# Patient Record
Sex: Female | Born: 1985 | Hispanic: Yes | Marital: Single | Smoking: Never smoker
Health system: Southern US, Community
[De-identification: ages and names within clinical notes are randomized; demographics above are authoritative.]

## PROBLEM LIST (undated history)

## (undated) DIAGNOSIS — R519 Headache, unspecified: Secondary | ICD-10-CM

## (undated) DIAGNOSIS — F419 Anxiety disorder, unspecified: Secondary | ICD-10-CM

## (undated) DIAGNOSIS — Z789 Other specified health status: Secondary | ICD-10-CM

## (undated) DIAGNOSIS — R51 Headache: Secondary | ICD-10-CM

## (undated) HISTORY — PX: NO PAST SURGERIES: SHX2092

---
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
## 2006-01-09 ENCOUNTER — Ambulatory Visit: Payer: Self-pay | Admitting: Certified Nurse Midwife

## 2006-01-09 ENCOUNTER — Inpatient Hospital Stay (HOSPITAL_COMMUNITY): Admission: AD | Admit: 2006-01-09 | Discharge: 2006-01-12 | Payer: Self-pay | Admitting: *Deleted

## 2006-01-09 ENCOUNTER — Inpatient Hospital Stay (HOSPITAL_COMMUNITY): Admission: AD | Admit: 2006-01-09 | Discharge: 2006-01-09 | Payer: Self-pay | Admitting: *Deleted

## 2006-01-21 ENCOUNTER — Inpatient Hospital Stay (HOSPITAL_COMMUNITY): Admission: AD | Admit: 2006-01-21 | Discharge: 2006-01-21 | Payer: Self-pay | Admitting: Family Medicine

## 2006-09-21 IMAGING — US US OB FOLLOW-UP
1 series · 13 of 28 positions shown · non-contrast
Comparison: none

CLINICAL DATA: 17 week 0 day assigned gestational age by LMP and early ultrasound.  Evaluate fetal anatomy and growth.

[Series 1: us ob follow-up · 0.18mm/px · 13 of 70 slices shown]
[im 3/70]
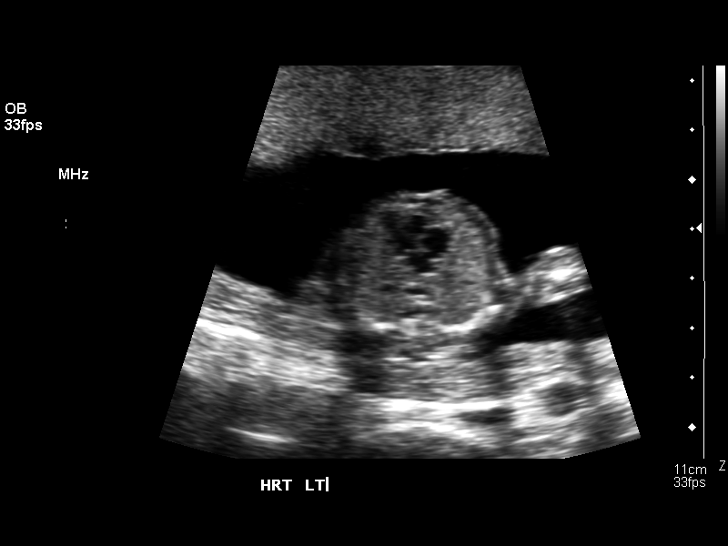
[im 8/70]
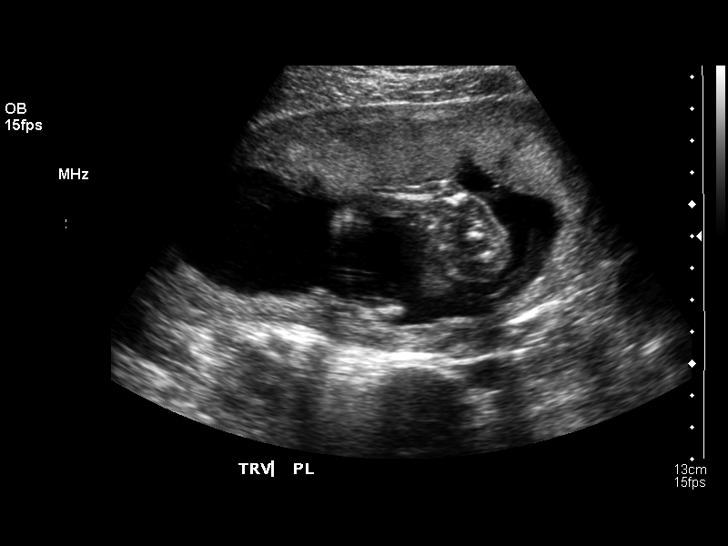
[im 13/70]
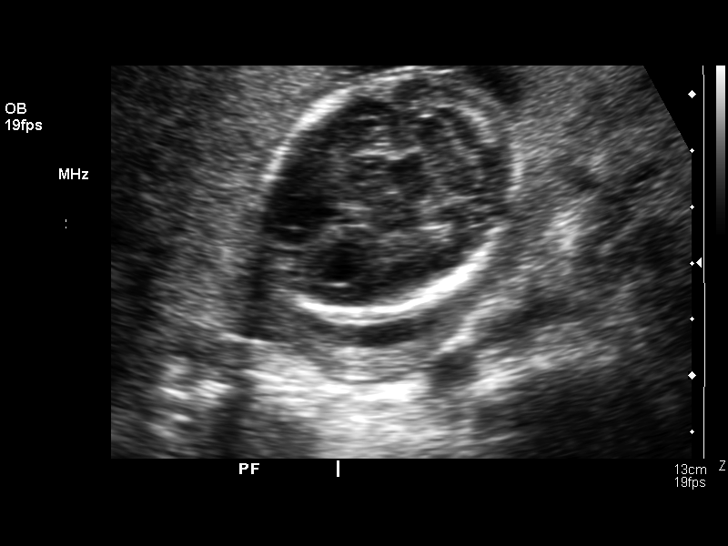
[im 18/70]
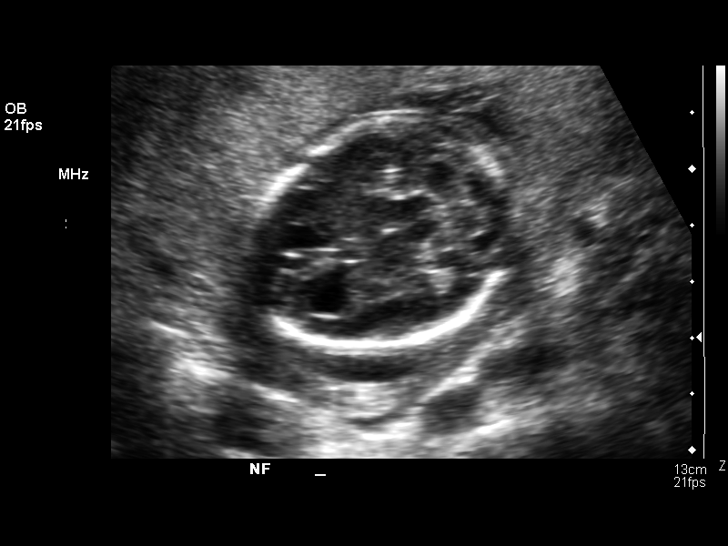
[im 24/70]
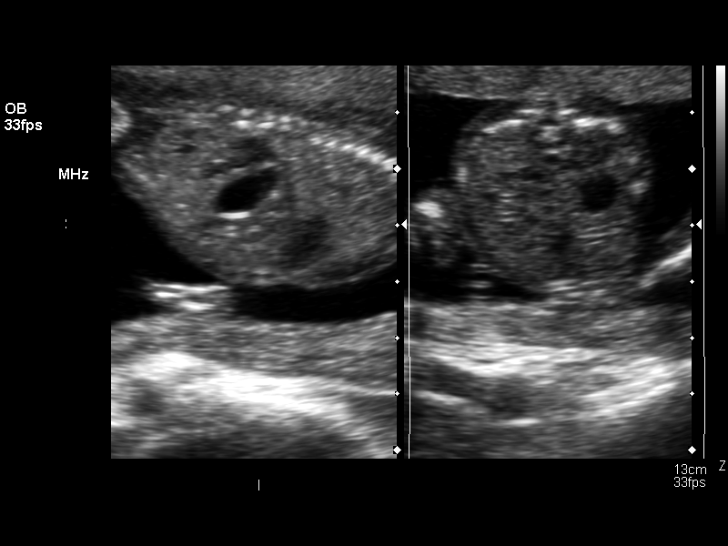
[im 29/70]
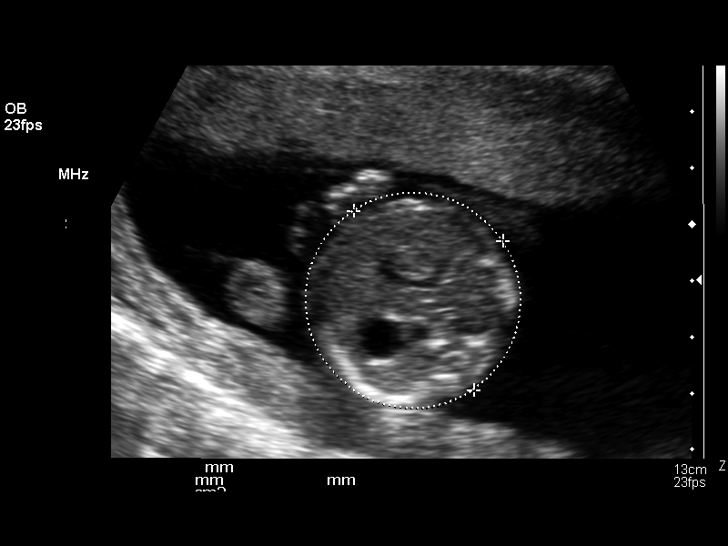
[im 36/70]
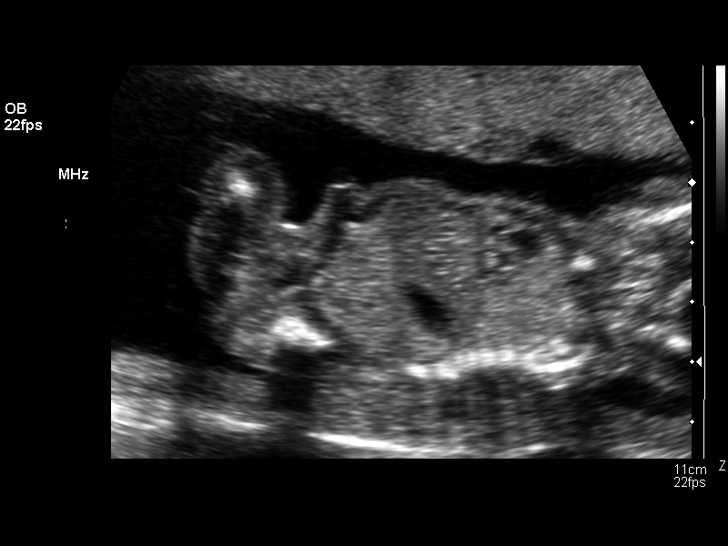
[im 41/70]
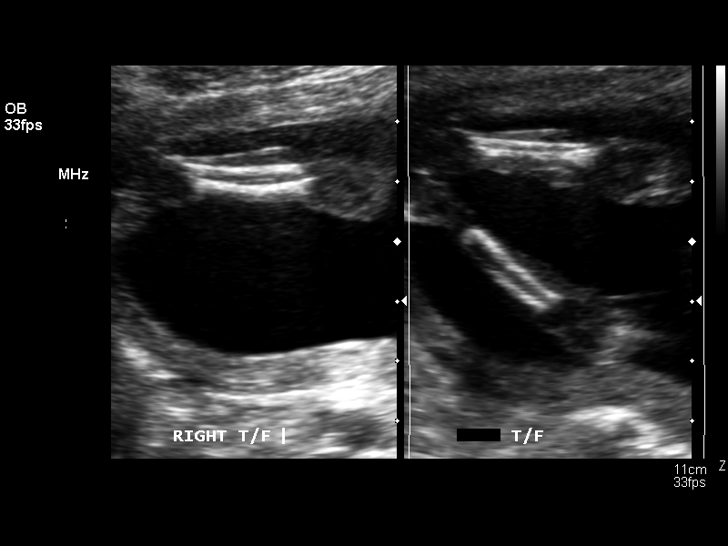
[im 47/70]
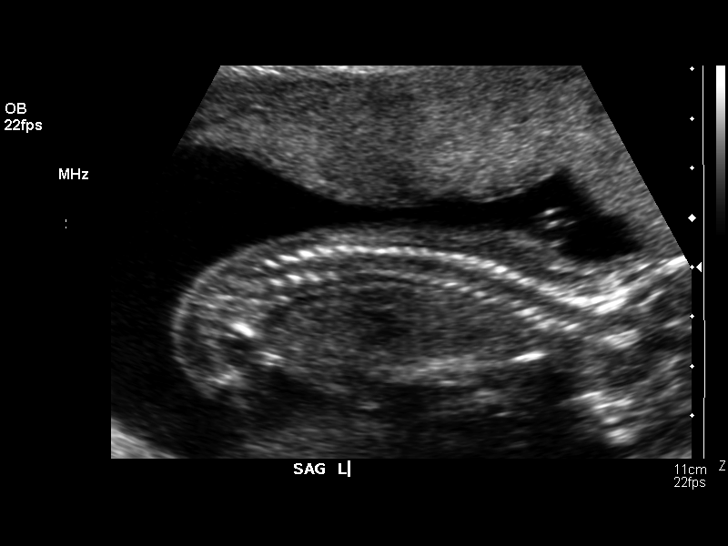
[im 52/70]
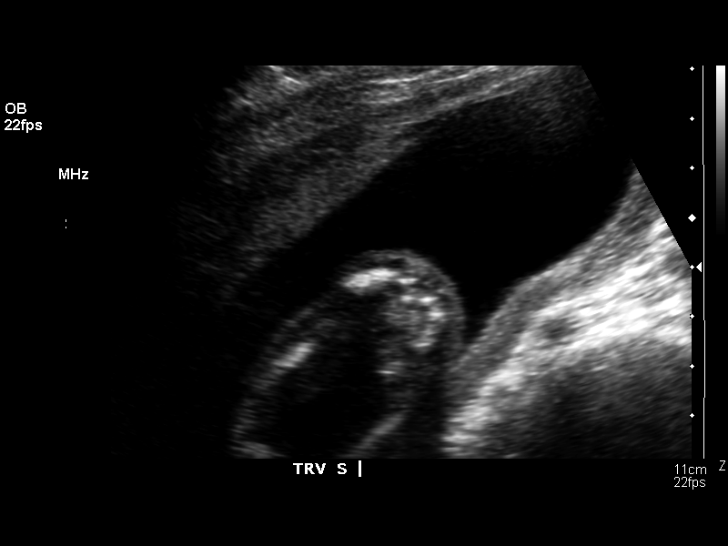
[im 57/70]
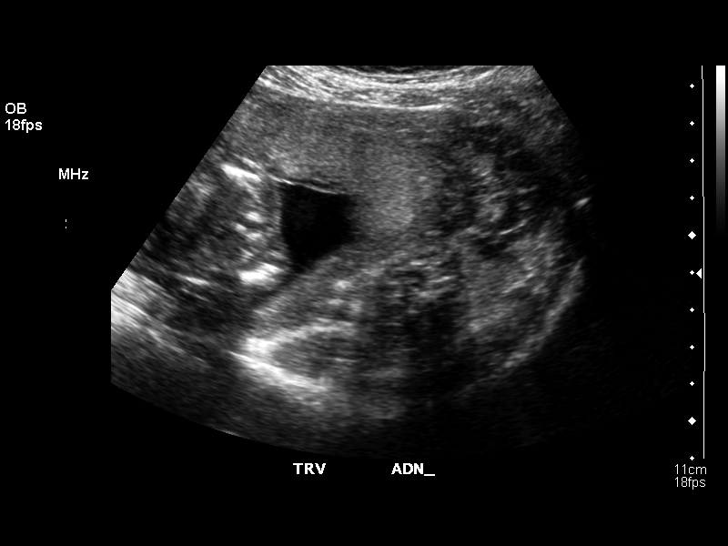
[im 62/70]
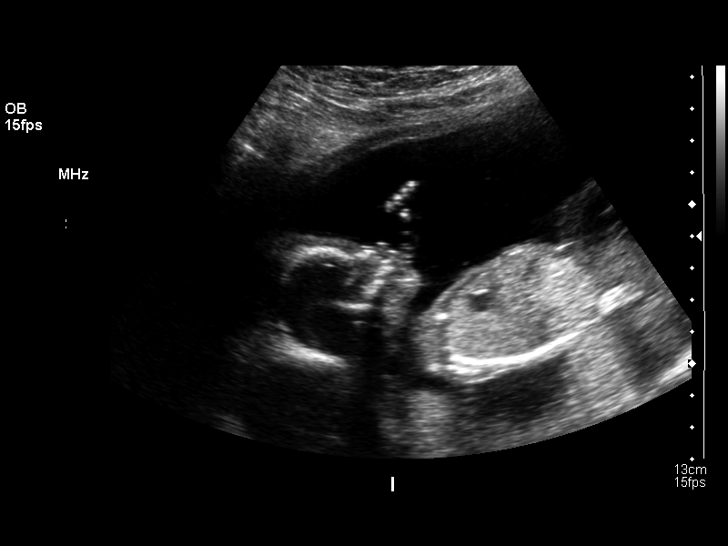
[im 67/70]
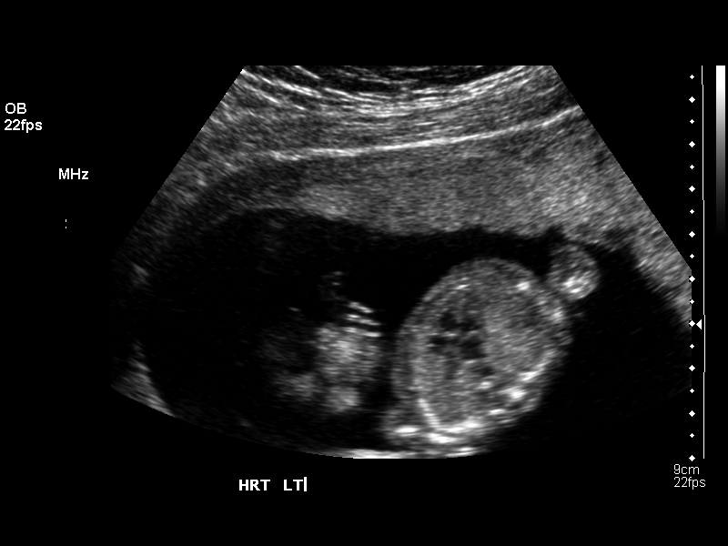

[13 of 28 positions shown; findings below may reference images not displayed]

OBSTETRICAL ULTRASOUND RE-EVALUATION:
 Number of Fetuses:  1
 Heart Rate:  144
 Movement:  Yes
 Breathing:  No
 Presentation:  Cephalic
 Placental Location:  Anterior
 Grade:  I
 Previa:  No
 Amniotic Fluid (subjective):  Normal
 Amniotic Fluid (objective):  4.8 cm Vertical pocket 

 FETAL BIOMETRY
 BPD:  3.9 cm   18 w 0 d
 HC:  14.8 cm  17 w 6 d
 AC:  12.0 cm   17 w 5 d
 FL:   2.5 cm  17 w 3 d

 Mean GA:  17 w 5 d
 Assigned GA:  17 w 0 d  Assigned EDC:  01/12/06

 FETAL ANATOMY
 Lateral Ventricles:  Visualized 
 Thalami/CSP:  Visualized 
 Posterior Fossa:  Visualized 
 Nuchal Region:  Visualized 
 Spine:  Visualized 
 4 Chamber Heart on Left:  Visualized 
 Stomach on Left:  Visualized 
 3 Vessel Cord:  Visualized 
 Cord Insertion Site:  Visualized 
 Kidneys:  Visualized 
 Bladder:  Visualized 
 Extremities:  Visualized 

 ADDITIONAL ANATOMY VISUALIZED:  LVOT, RVOT, upper lip, orbits, profile, diaphragm, heel, 5th digit, ductal arch, aortic arch, and male genitalia.

 MATERNAL UTERINE AND ADNEXAL FINDINGS
 Cervix:  3.2 cm Transabdominally.  Both ovaries are unremarkable.
IMPRESSION: 1.  Assigned gestational age is currently 17 weeks 0 days.  Appropriate fetal growth.
 2.  No evidence of fetal anatomic abnormality.

## 2007-01-06 IMAGING — US US OB FOLLOW-UP
1 series · 13 of 28 positions shown · non-contrast
Comparison: none

CLINICAL DATA: Evaluate growth; assigned gestational age is 32 weeks 2 days.

[Series 1: us ob follow-up · 0.35mm/px · 13 of 38 slices shown]
[im 2/38]
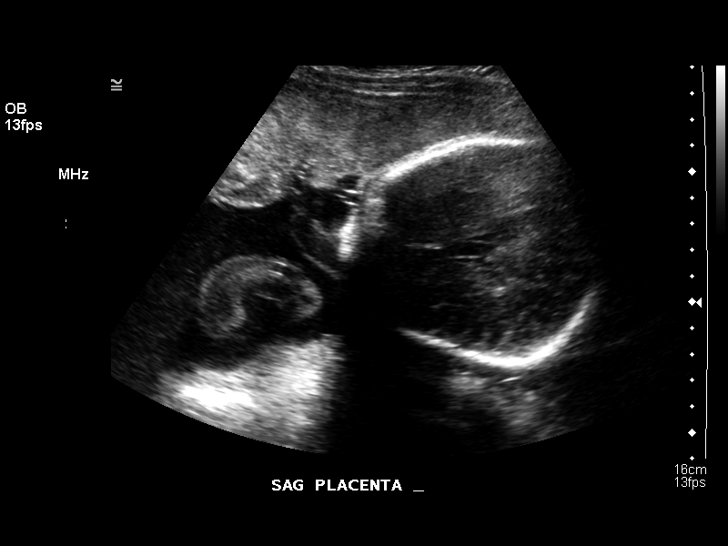
[im 5/38]
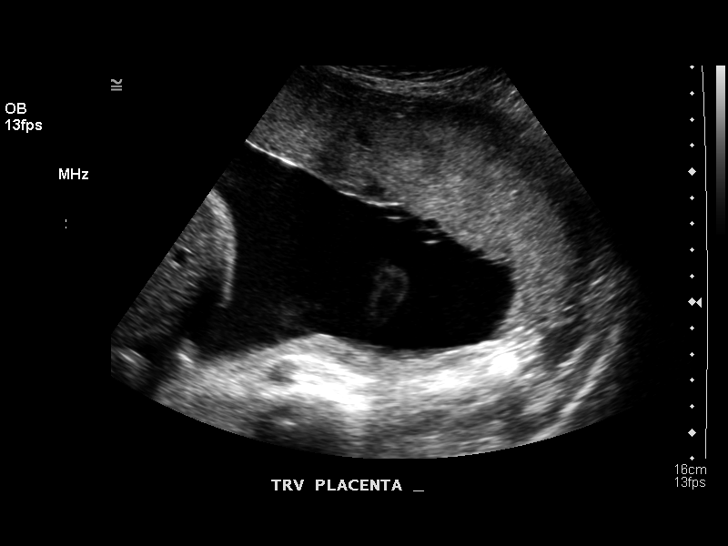
[im 7/38]
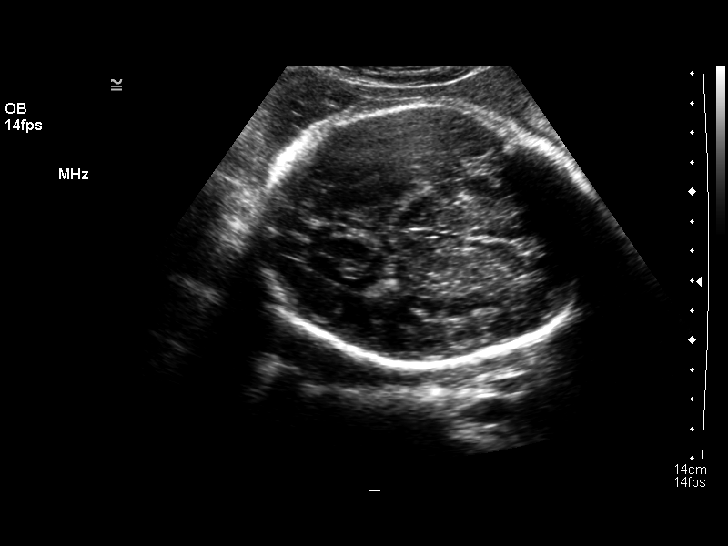
[im 10/38]
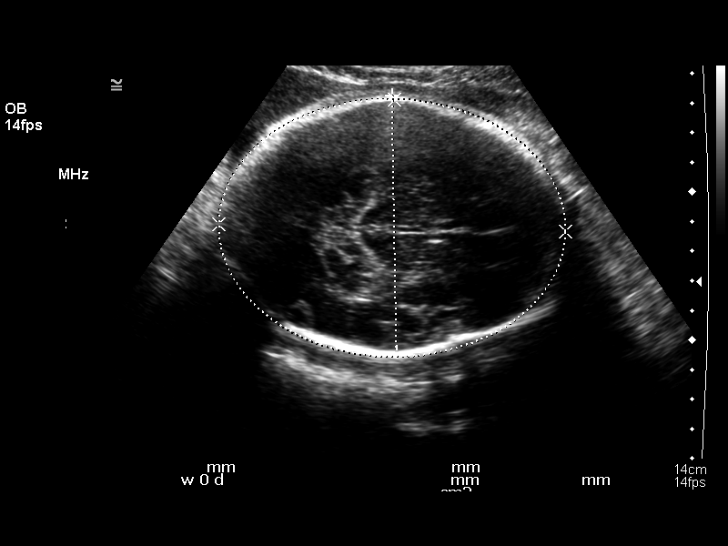
[im 13/38]
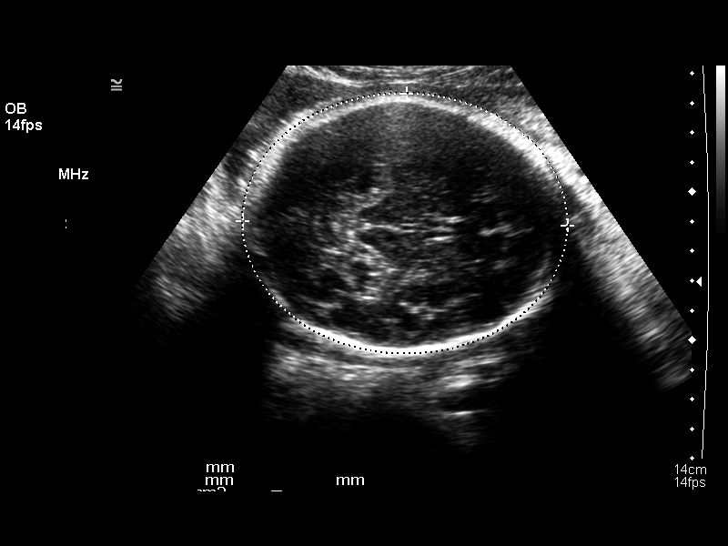
[im 16/38]
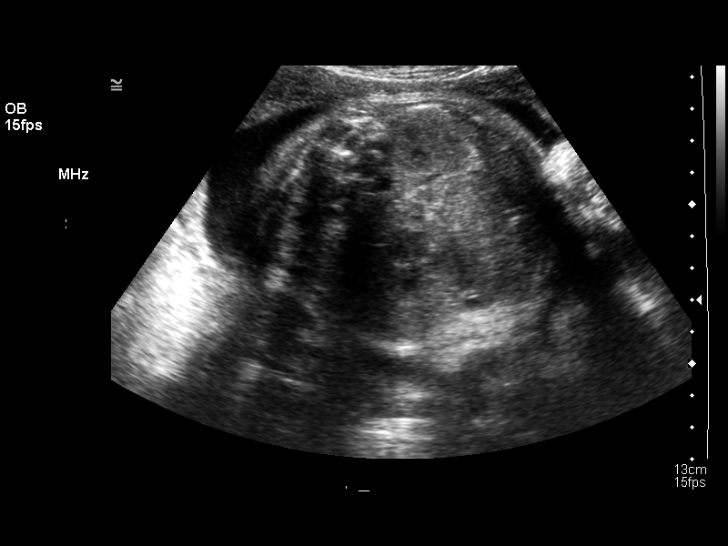
[im 20/38]
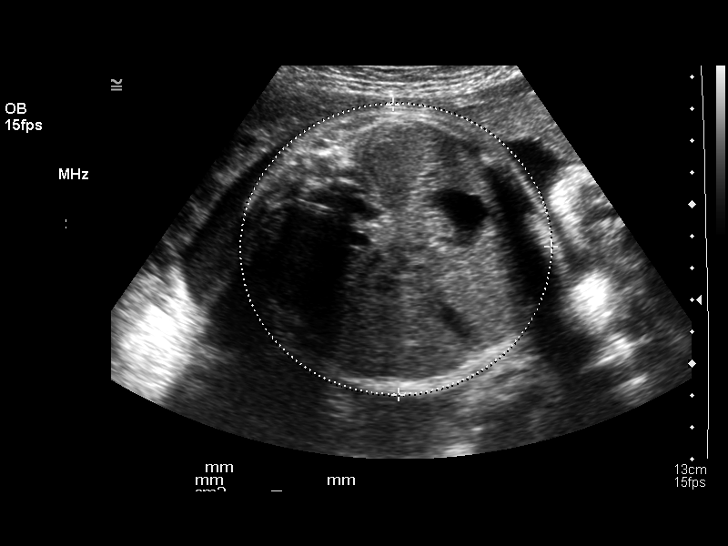
[im 22/38]
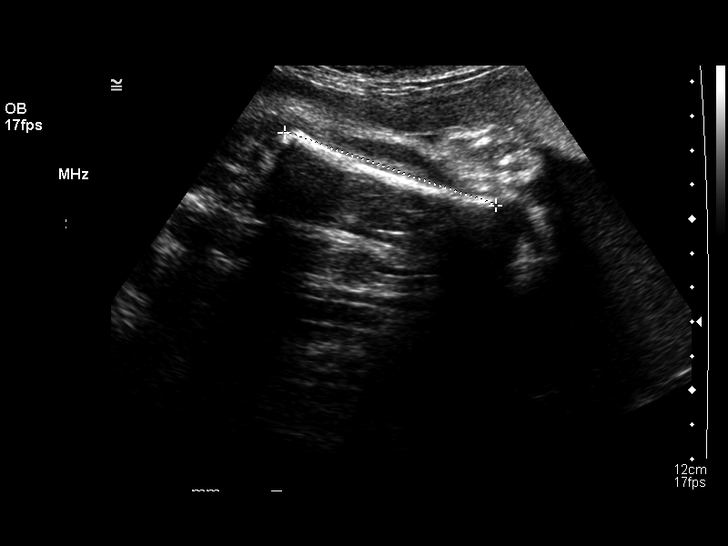
[im 25/38]
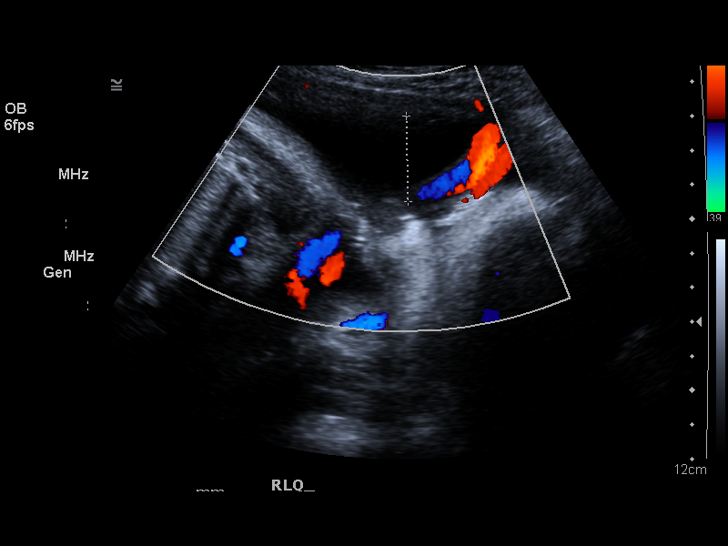
[im 28/38]
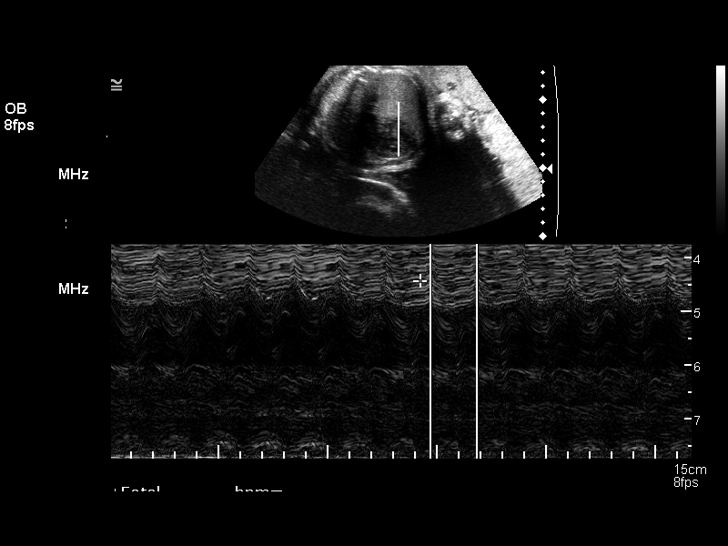
[im 31/38]
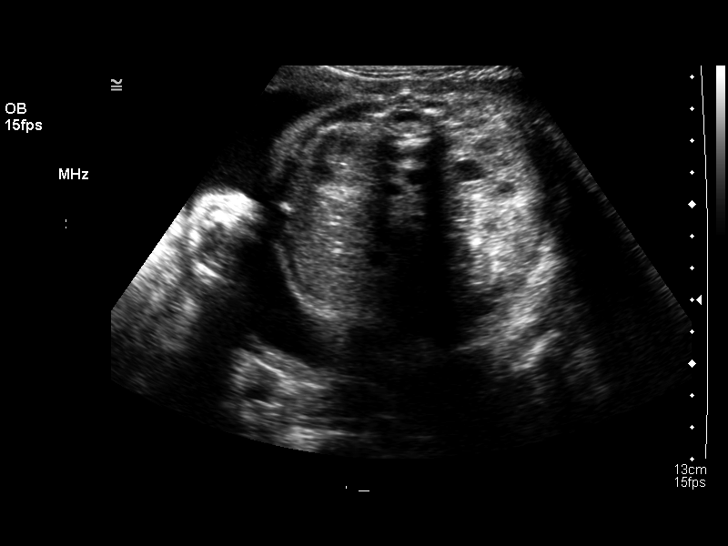
[im 33/38]
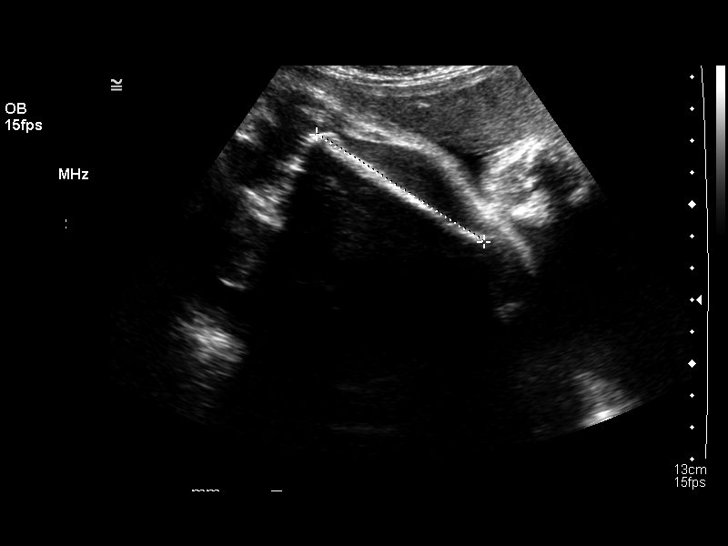
[im 36/38]
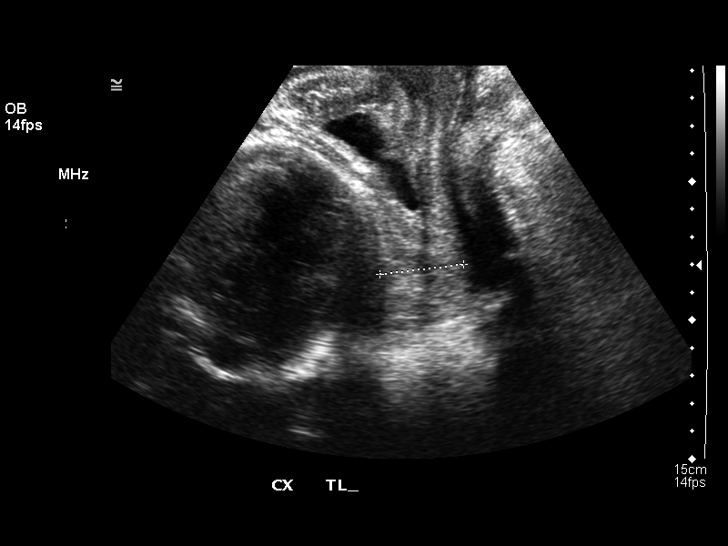

[13 of 28 positions shown; findings below may reference images not displayed]

OBSTETRICAL ULTRASOUND RE-EVALUATION:
 Number of Fetuses: 1
 Heart Rate:  141
 Movement:  Yes
 Breathing:  Yes
 Presentation:  Cephalic
 Placental Location:  Anterior
 Grade:  II
 Previa:  No
 Amniotic Fluid (subjective):  Normal
 Amniotic Fluid (objective):  16.5 cm AFI (5th -95th%ile = 8.6 – 24.2 cm for 32 wks)

 FETAL BIOMETRY
 BPD:  8.5 cm  34 w 1 d
 HC:  30.5 cm   34 w 0 d
 AC:  28.4 cm   32 w 3 d
 FL:   6.4 cm   32 w 6 d

 Mean GA:  33 w 3 d  US EDC:  01/04/06
 Assigned GA:  32 w 2 d  Assigned EDC:  01/12/06

 EFW:  1191 g (H) 75th – 90th%ile (4877 – 8605 g) For 32 wks

 FETAL ANATOMY
 Lateral Ventricles:  Visualized 
 Thalami/CSP:  Visualized 
 Posterior Fossa:  Previously seen 
 Nuchal Region:  Previously seen 
 Spine:  Previously seen 
 4 Chamber Heart on Left:  Previously seen 
 Stomach on Left:  Visualized 
 3 Vessel Cord:  Visualized 
 Cord Insertion Site:  Previously seen 
 Kidneys:  Visualized 
 Bladder:  Visualized 
 Extremities:  Previously seen 

 MATERNAL UTERINE AND ADNEXAL FINDINGS
 Cervix:  3.1 cm Translabially
IMPRESSION: There is a single living intrauterine gestation in cephalic presentation.  The mean gestational age by today's ultrasound is 33 weeks 3 days which is approximately 1 week older than the assigned gestational age.  The estimated fetal weight is 1191 which is at the 75th – 90th percentile for a 32 week gestation.  The fetal indices are concordant.

## 2007-02-08 IMAGING — US US OB FOLLOW-UP
1 series · 13 of 28 positions shown · non-contrast
Comparison: none

CLINICAL DATA: 37 week 0 day assigned gestational age.  Measuring large for dates.  Evaluate fetal growth and amniotic fluid.

[Series 1: us ob follow-up · 0.33mm/px · 13 of 39 slices shown]
[im 2/39]
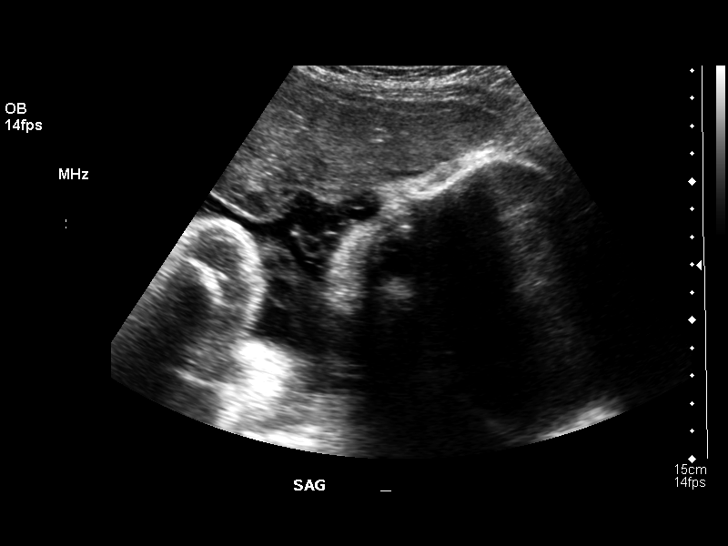
[im 5/39]
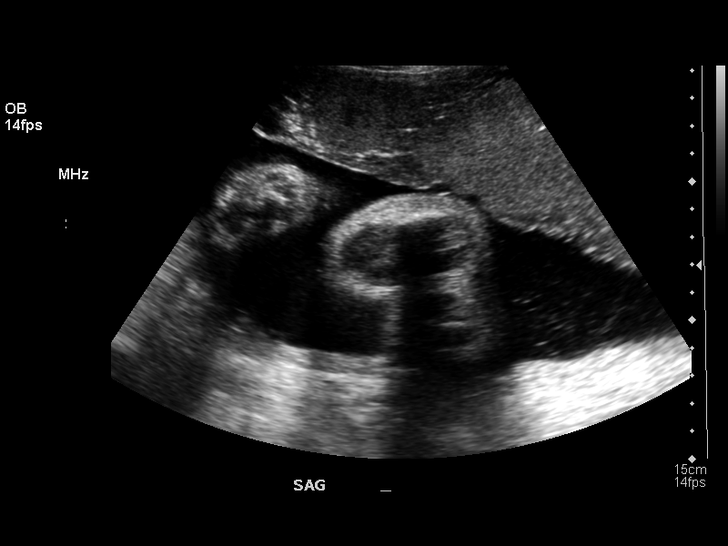
[im 8/39]
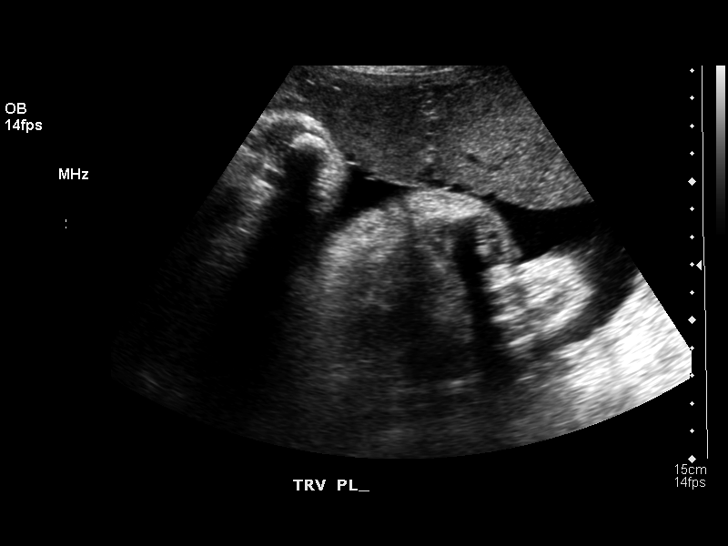
[im 10/39]
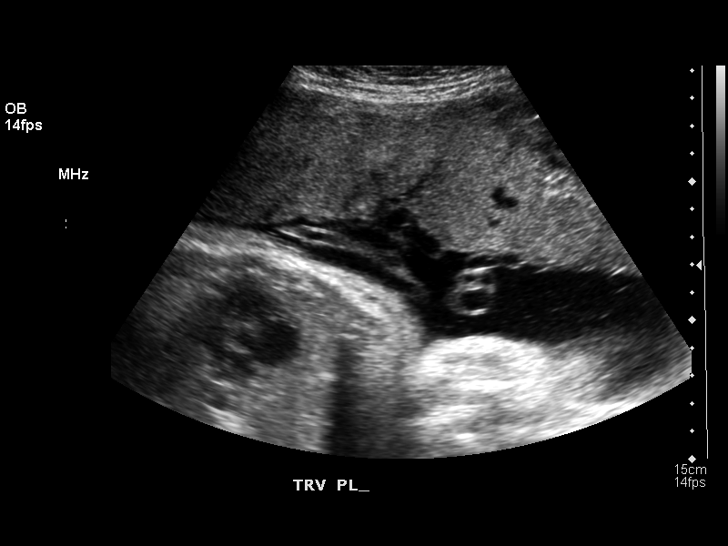
[im 13/39]
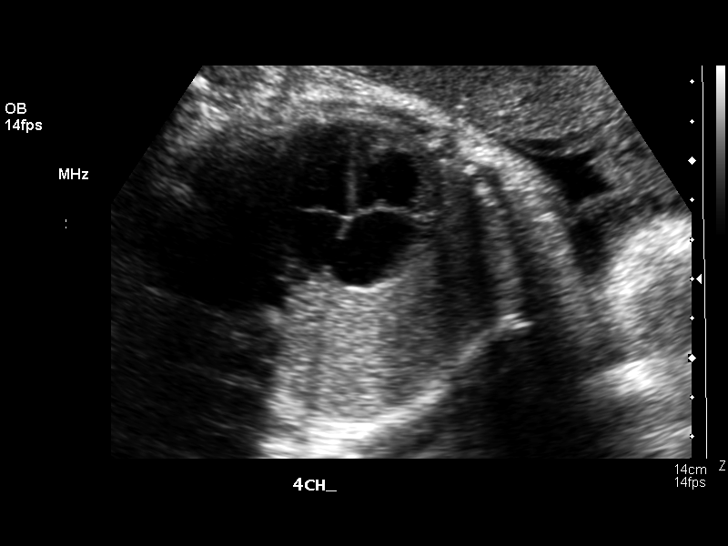
[im 16/39]
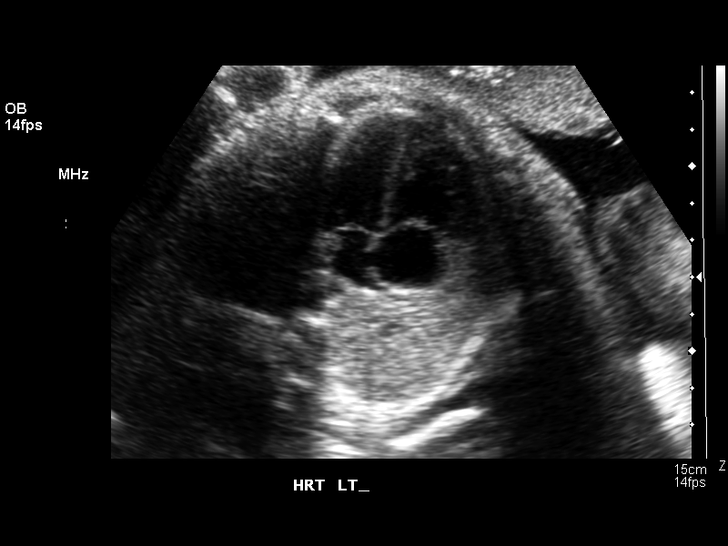
[im 20/39]
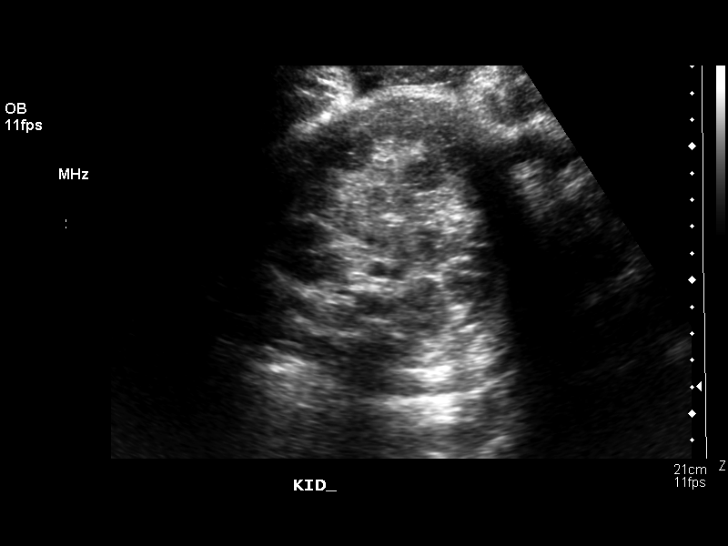
[im 23/39]
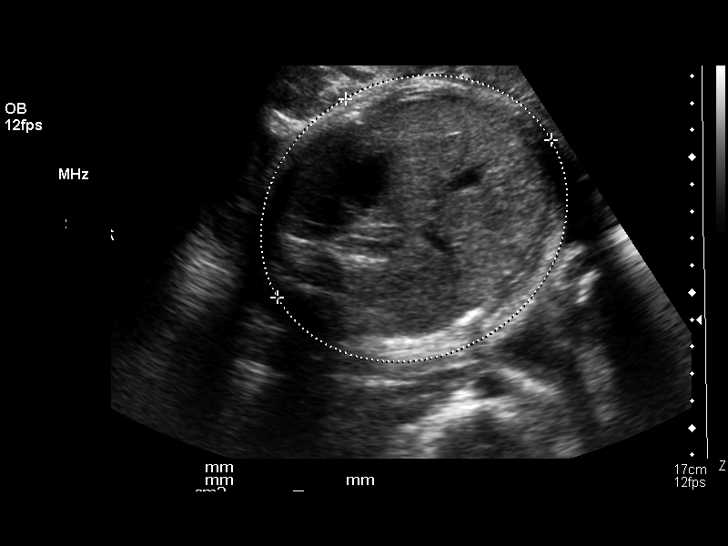
[im 26/39]
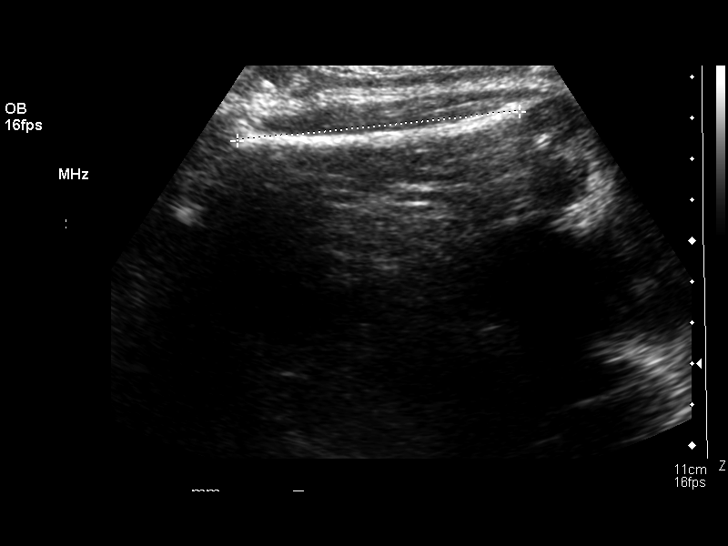
[im 29/39]
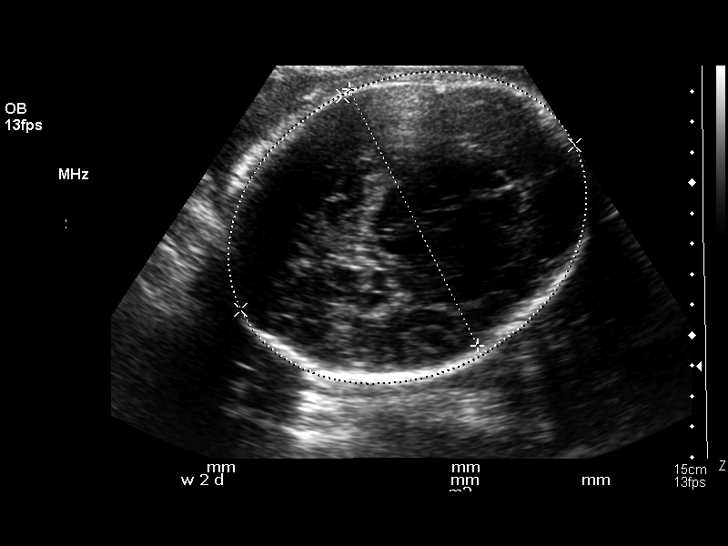
[im 31/39]
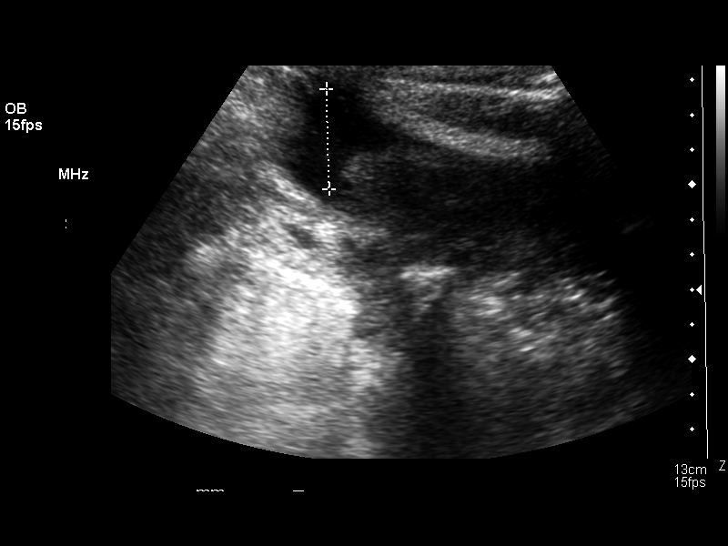
[im 34/39]
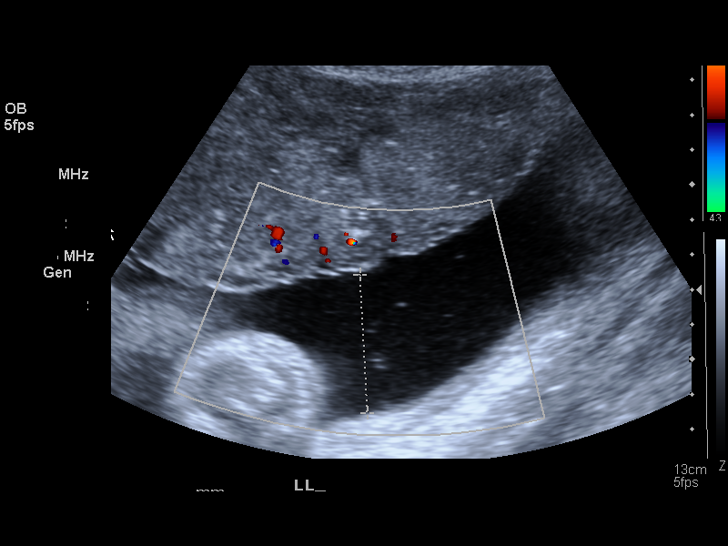
[im 37/39]
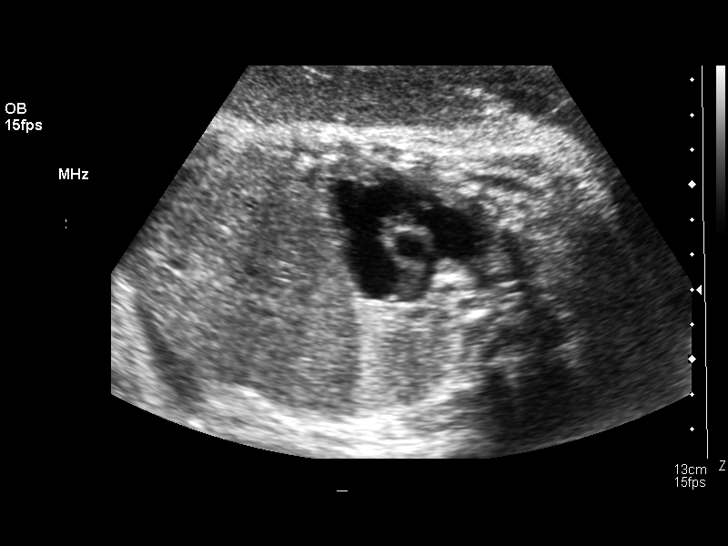

[13 of 28 positions shown; findings below may reference images not displayed]

OBSTETRICAL ULTRASOUND RE-EVALUATION:
 Number of Fetuses:  1
 Heart Rate:  133 bpm
 Movement:  yes
 Breathing:    yes
 Presentation:  cephalic
 Placental Location:  anterior
 Grade:  II
 Previa:  no
 Amniotic Fluid (subjective):  normal
 Amniotic Fluid (objective):  AFI 18.4 cm (6th-36th %ile = 7.5 to 24.4 cm for 37 weeks) 

 FETAL BIOMETRY
 BPD:  9.4 cm  38 w 3 d
 HC:  34.2 cm  39 w 3 d
 AC:  34.4 cm  38 w 2 d
 FL:  6.9 cm  35 w 6 d

 Mean GA:  38 w 0 d  US EDC:  01/05/06
 Assigned GA:  37 w 0 d  Assigned EDC:  01/12/06

 EFW:  9992 grams - 26th-75th %ile (8622 – 0990 g) for 37 weeks 

 FETAL ANATOMY
 Lateral Ventricles:  visualized 
 Thalami/CSP:  visualized 
 Posterior Fossa:  previously seen 
 Nuchal Region:  previously seen 
 Spine:  previously seen 
 4 Chamber Heart on Left:  visualized 
 Stomach on Left:  visualized 
 3 Vessel Cord:  visualized 
 Cord Insertion Site:  previously seen 
 Kidneys:  visualized 
 Bladder:  visualized 
 Extremities:  previously seen 

 ADDITIONAL ANATOMY VISUALIZED:  LVOT, RVOT, and diaphragm.

 MATERNAL UTERINE AND ADNEXAL FINDINGS
 Cervix:  Not evaluated; >34 weeks
IMPRESSION: 1.  Single living intrauterine fetus in cephalic presentation with assigned gestational age of 37 weeks 0 days.  Appropriate fetal growth, with EFW between 75th and 90th percentile.
 2.  Normal amniotic fluid volume, with AFI of 18.4 cm.

## 2008-05-31 ENCOUNTER — Emergency Department (HOSPITAL_COMMUNITY): Admission: EM | Admit: 2008-05-31 | Discharge: 2008-05-31 | Payer: Self-pay | Admitting: Family Medicine

## 2009-06-04 ENCOUNTER — Emergency Department (HOSPITAL_COMMUNITY): Admission: EM | Admit: 2009-06-04 | Discharge: 2009-06-04 | Payer: Self-pay | Admitting: Emergency Medicine

## 2009-06-12 ENCOUNTER — Emergency Department (HOSPITAL_COMMUNITY): Admission: EM | Admit: 2009-06-12 | Discharge: 2009-06-12 | Payer: Self-pay | Admitting: Emergency Medicine

## 2011-08-25 NOTE — L&D Delivery Note (Signed)
Delivery Note At 4:53 AM a viable and healthy female was delivered via Vaginal, Spontaneous Delivery (Presentation: Right; Occiput Anterior).  APGAR: 9, 9; weight .   Placenta status: Intact, Spontaneous.  Cord: 3 vessels   Anesthesia: Epidural  Episiotomy: None Lacerations: 2nd degree perineal Suture Repair: 3.0 vicryl Est. Blood Loss (mL): 350cc  Mom to postpartum.  Baby to nursery-stable.  Bernell Sigal H. 04/02/2012, 5:18 AM

## 2011-09-24 ENCOUNTER — Ambulatory Visit (INDEPENDENT_AMBULATORY_CARE_PROVIDER_SITE_OTHER): Payer: Self-pay | Admitting: Internal Medicine

## 2011-09-24 DIAGNOSIS — H9209 Otalgia, unspecified ear: Secondary | ICD-10-CM

## 2011-09-24 DIAGNOSIS — J310 Chronic rhinitis: Secondary | ICD-10-CM

## 2011-09-24 DIAGNOSIS — J029 Acute pharyngitis, unspecified: Secondary | ICD-10-CM

## 2011-09-24 LAB — POCT RAPID STREP A (OFFICE): Rapid Strep A Screen: NEGATIVE

## 2011-09-24 MED ORDER — IPRATROPIUM BROMIDE 0.06 % NA SOLN: 2.0000 | Freq: Four times a day (QID) | NASAL | Status: DC

## 2011-09-24 NOTE — Progress Notes (Signed)
  Subjective:    Patient ID: Kristen Baldwin, female    DOB: [birth year], 26 y.o.   MRN: [id number]  Sore Throat  This is a new problem. The current episode started in the past 7 days. The problem has been gradually worsening. The pain is worse on the right side. There has been no fever. The pain is mild. Associated symptoms include congestion and ear pain. Pertinent negatives include no coughing, ear discharge or hoarse voice. She has had no exposure to strep or mono. She has tried nothing for the symptoms.  Otalgia  Pertinent negatives include no coughing or ear discharge.      Review of Systems  Constitutional: Negative.   HENT: Positive for ear pain and congestion. Negative for hoarse voice and ear discharge.   Eyes: Negative.   Respiratory: Negative.  Negative for cough.   Cardiovascular: Negative.   Gastrointestinal: Negative.   Genitourinary: Negative.   Skin: Negative.        Objective:   Physical Exam  Constitutional: She is oriented to person, place, and time. She appears well-developed and well-nourished.  HENT:  Head: Normocephalic.  Right Ear: External ear normal.  Left Ear: External ear normal.       Right TM flat without fluid, good light reflex.  Left TM only slightly flat, not injected, no erythema.  Eyes: Pupils are equal, round, and reactive to light.  Neck: Neck supple.  Cardiovascular: Normal rate, regular rhythm and normal heart sounds.   Pulmonary/Chest: Effort normal and breath sounds normal.  Abdominal: Soft.  Neurological: She is alert and oriented to person, place, and time.  Skin: Skin is warm and dry.          Assessment & Plan:   1. Otalgia  POCT rapid strep A  2. Rhinitis     ETD and rhinitis, likely due to viral URI as symptoms have been for 2-3 days.  Use Atrovent Nasal spray, tylenol and warm salt water gargles.  See pt instructions.

## 2011-09-24 NOTE — Patient Instructions (Signed)
Kristen Baldwin has some rhinitis and likely ETD causing her discomfort.  She reports she is [redacted] weeks pregnant and in the process of securing Medicaid and plans to see the OB practice at [facility].  She is advised to continue with her Prenatal vitamin daily.  Use Atrovent NS prescribed for her rhinitis and Tylenol for her sore throat along with salt water gargles.  RTC in 2-3 days if not improving or other symptoms arise.

## 2011-11-09 LAB — OB RESULTS CONSOLE ANTIBODY SCREEN: Antibody Screen: NEGATIVE

## 2011-11-09 LAB — OB RESULTS CONSOLE GC/CHLAMYDIA
Chlamydia: NEGATIVE
Gonorrhea: NEGATIVE

## 2011-11-09 LAB — OB RESULTS CONSOLE RUBELLA ANTIBODY, IGM: Rubella: IMMUNE

## 2011-11-09 LAB — OB RESULTS CONSOLE HIV ANTIBODY (ROUTINE TESTING): HIV: NONREACTIVE

## 2011-11-09 LAB — OB RESULTS CONSOLE GBS: GBS: NEGATIVE

## 2011-11-09 LAB — OB RESULTS CONSOLE ABO/RH

## 2012-04-02 ENCOUNTER — Inpatient Hospital Stay (HOSPITAL_COMMUNITY)
Admission: AD | Admit: 2012-04-02 | Discharge: 2012-04-03 | DRG: 775 | Disposition: A | Discharge status: Home / Self Care | Payer: Medicaid Other | Source: Ambulatory Visit | Attending: Obstetrics and Gynecology | Admitting: Obstetrics and Gynecology

## 2012-04-02 ENCOUNTER — Inpatient Hospital Stay (HOSPITAL_COMMUNITY): Payer: Medicaid Other | Admitting: Anesthesiology

## 2012-04-02 ENCOUNTER — Encounter (HOSPITAL_COMMUNITY): Payer: Self-pay | Admitting: *Deleted

## 2012-04-02 ENCOUNTER — Encounter (HOSPITAL_COMMUNITY): Payer: Self-pay | Admitting: Anesthesiology

## 2012-04-02 HISTORY — DX: Other specified health status: Z78.9

## 2012-04-02 LAB — CBC
HCT: 36.2 % (ref 36.0–46.0)
MCV: 87.9 fL (ref 78.0–100.0)
Platelets: 245 10*3/uL (ref 150–400)
RBC: 4.12 MIL/uL (ref 3.87–5.11)
WBC: 13.1 10*3/uL — ABNORMAL HIGH (ref 4.0–10.5)

## 2012-04-02 LAB — TYPE AND SCREEN: Antibody Screen: NEGATIVE

## 2012-04-02 LAB — ABO/RH: ABO/RH(D): O POS

## 2012-04-02 MED ORDER — WITCH HAZEL-GLYCERIN EX PADS: 1.0000 "application " | MEDICATED_PAD | CUTANEOUS | Status: DC | PRN

## 2012-04-02 MED ORDER — DIPHENHYDRAMINE HCL 25 MG PO CAPS: 25.0000 mg | ORAL_CAPSULE | Freq: Four times a day (QID) | ORAL | Status: DC | PRN

## 2012-04-02 MED ORDER — IBUPROFEN 600 MG PO TABS
600.0000 mg | ORAL_TABLET | Freq: Four times a day (QID) | ORAL | Status: DC
  Administered 2012-04-02 – 2012-04-03 (×6): 600 mg via ORAL
  Filled 2012-04-02 (×6): qty 1

## 2012-04-02 MED ORDER — ONDANSETRON HCL 4 MG/2ML IJ SOLN: 4.0000 mg | INTRAMUSCULAR | Status: DC | PRN

## 2012-04-02 MED ORDER — EPHEDRINE 5 MG/ML INJ
10.0000 mg | INTRAVENOUS | Status: DC | PRN
  Filled 2012-04-02: qty 4

## 2012-04-02 MED ORDER — DIPHENHYDRAMINE HCL 50 MG/ML IJ SOLN: 12.5000 mg | INTRAMUSCULAR | Status: DC | PRN

## 2012-04-02 MED ORDER — LACTATED RINGERS IV SOLN
500.0000 mL | Freq: Once | INTRAVENOUS | Status: AC
  Administered 2012-04-02: 500 mL via INTRAVENOUS

## 2012-04-02 MED ORDER — FENTANYL CITRATE 0.05 MG/ML IJ SOLN
INTRAMUSCULAR | Status: DC | PRN
  Administered 2012-04-02: 15 ug via INTRATHECAL

## 2012-04-02 MED ORDER — DIBUCAINE 1 % RE OINT: 1.0000 "application " | TOPICAL_OINTMENT | RECTAL | Status: DC | PRN

## 2012-04-02 MED ORDER — LANOLIN HYDROUS EX OINT: TOPICAL_OINTMENT | CUTANEOUS | Status: DC | PRN

## 2012-04-02 MED ORDER — IBUPROFEN 600 MG PO TABS: 600.0000 mg | ORAL_TABLET | Freq: Four times a day (QID) | ORAL | Status: DC | PRN

## 2012-04-02 MED ORDER — METHYLERGONOVINE MALEATE 0.2 MG/ML IJ SOLN: 0.2000 mg | INTRAMUSCULAR | Status: DC | PRN

## 2012-04-02 MED ORDER — PHENYLEPHRINE 40 MCG/ML (10ML) SYRINGE FOR IV PUSH (FOR BLOOD PRESSURE SUPPORT)
80.0000 ug | PREFILLED_SYRINGE | INTRAVENOUS | Status: DC | PRN
  Filled 2012-04-02: qty 5

## 2012-04-02 MED ORDER — IPRATROPIUM BROMIDE 0.06 % NA SOLN
2.0000 | Freq: Four times a day (QID) | NASAL | Status: DC
  Filled 2012-04-02: qty 15

## 2012-04-02 MED ORDER — LACTATED RINGERS IV SOLN
INTRAVENOUS | Status: DC
  Administered 2012-04-02: 03:00:00 via INTRAVENOUS

## 2012-04-02 MED ORDER — OXYCODONE-ACETAMINOPHEN 5-325 MG PO TABS
1.0000 | ORAL_TABLET | ORAL | Status: DC | PRN
Start: 2012-04-02 — End: 2012-04-03
  Administered 2012-04-02 – 2012-04-03 (×2): 1 via ORAL
  Filled 2012-04-02 (×2): qty 1

## 2012-04-02 MED ORDER — FLEET ENEMA 7-19 GM/118ML RE ENEM: 1.0000 | ENEMA | RECTAL | Status: DC | PRN

## 2012-04-02 MED ORDER — PRENATAL MULTIVITAMIN CH
1.0000 | ORAL_TABLET | Freq: Every day | ORAL | Status: DC
  Administered 2012-04-02 – 2012-04-03 (×2): 1 via ORAL
  Filled 2012-04-02 (×2): qty 1

## 2012-04-02 MED ORDER — OXYTOCIN BOLUS FROM INFUSION
250.0000 mL | Freq: Once | INTRAVENOUS | Status: DC
  Filled 2012-04-02: qty 500

## 2012-04-02 MED ORDER — EPHEDRINE 5 MG/ML INJ: 10.0000 mg | INTRAVENOUS | Status: DC | PRN

## 2012-04-02 MED ORDER — ONDANSETRON HCL 4 MG PO TABS: 4.0000 mg | ORAL_TABLET | ORAL | Status: DC | PRN

## 2012-04-02 MED ORDER — ACETAMINOPHEN 325 MG PO TABS: 650.0000 mg | ORAL_TABLET | ORAL | Status: DC | PRN

## 2012-04-02 MED ORDER — TETANUS-DIPHTH-ACELL PERTUSSIS 5-2.5-18.5 LF-MCG/0.5 IM SUSP
0.5000 mL | Freq: Once | INTRAMUSCULAR | Status: AC
  Administered 2012-04-03: 0.5 mL via INTRAMUSCULAR
  Filled 2012-04-02: qty 0.5

## 2012-04-02 MED ORDER — BENZOCAINE-MENTHOL 20-0.5 % EX AERO
1.0000 "application " | INHALATION_SPRAY | CUTANEOUS | Status: DC | PRN
  Filled 2012-04-02: qty 56

## 2012-04-02 MED ORDER — CITRIC ACID-SODIUM CITRATE 334-500 MG/5ML PO SOLN: 30.0000 mL | ORAL | Status: DC | PRN

## 2012-04-02 MED ORDER — OXYTOCIN 40 UNITS IN LACTATED RINGERS INFUSION - SIMPLE MED
62.5000 mL/h | Freq: Once | INTRAVENOUS | Status: AC
  Administered 2012-04-02: 62.5 mL/h via INTRAVENOUS
  Filled 2012-04-02: qty 1000

## 2012-04-02 MED ORDER — METHYLERGONOVINE MALEATE 0.2 MG PO TABS: 0.2000 mg | ORAL_TABLET | ORAL | Status: DC | PRN

## 2012-04-02 MED ORDER — PHENYLEPHRINE 40 MCG/ML (10ML) SYRINGE FOR IV PUSH (FOR BLOOD PRESSURE SUPPORT): 80.0000 ug | PREFILLED_SYRINGE | INTRAVENOUS | Status: DC | PRN

## 2012-04-02 MED ORDER — FENTANYL CITRATE 0.05 MG/ML IJ SOLN: 15.0000 ug | Freq: Once | INTRAMUSCULAR | Status: DC

## 2012-04-02 MED ORDER — ONDANSETRON HCL 4 MG/2ML IJ SOLN: 4.0000 mg | Freq: Four times a day (QID) | INTRAMUSCULAR | Status: DC | PRN

## 2012-04-02 MED ORDER — LACTATED RINGERS IV SOLN: 500.0000 mL | INTRAVENOUS | Status: DC | PRN

## 2012-04-02 MED ORDER — LIDOCAINE HCL (PF) 1 % IJ SOLN
30.0000 mL | INTRAMUSCULAR | Status: DC | PRN
  Administered 2012-04-02: 30 mL via SUBCUTANEOUS
  Filled 2012-04-02: qty 30

## 2012-04-02 MED ORDER — FENTANYL CITRATE 0.05 MG/ML IJ SOLN
INTRAMUSCULAR | Status: AC
  Filled 2012-04-02: qty 2

## 2012-04-02 MED ORDER — OXYCODONE-ACETAMINOPHEN 5-325 MG PO TABS: 1.0000 | ORAL_TABLET | ORAL | Status: DC | PRN

## 2012-04-02 MED ORDER — ZOLPIDEM TARTRATE 5 MG PO TABS: 5.0000 mg | ORAL_TABLET | Freq: Every evening | ORAL | Status: DC | PRN

## 2012-04-02 MED ORDER — SENNOSIDES-DOCUSATE SODIUM 8.6-50 MG PO TABS
2.0000 | ORAL_TABLET | Freq: Every day | ORAL | Status: DC
  Administered 2012-04-02: 2 via ORAL

## 2012-04-02 MED ORDER — FENTANYL 2.5 MCG/ML BUPIVACAINE 1/10 % EPIDURAL INFUSION (WH - ANES)
14.0000 mL/h | INTRAMUSCULAR | Status: DC
  Administered 2012-04-02: 14 mL/h via EPIDURAL
  Filled 2012-04-02: qty 60

## 2012-04-02 MED ORDER — LIDOCAINE HCL (PF) 1 % IJ SOLN
INTRAMUSCULAR | Status: DC | PRN
  Administered 2012-04-02 (×3): 4 mL

## 2012-04-02 MED ORDER — SIMETHICONE 80 MG PO CHEW: 80.0000 mg | CHEWABLE_TABLET | ORAL | Status: DC | PRN

## 2012-04-02 NOTE — OB Triage Note (Signed)
Patient started UC about 1100 on 04/01/2012. Patient states SROM at 0200 with clear fluid and positive fetal movement.

## 2012-04-02 NOTE — Anesthesia Preprocedure Evaluation (Signed)

## 2012-04-02 NOTE — MAU Note (Signed)
Pt states, " I had been having contractions on and off all day, then my water broke at 2 am, and they have gotten stronger an closer."

## 2012-04-02 NOTE — H&P (Signed)
Kristen Baldwin is a 26 y.o. female presenting for Labor Pt p/w c/o painful contractions.  She was found to be 5/c/0 station and was admitted for labor. Her pregnancy has been uncomplicated History OB History    Grav Para Term Preterm Abortions TAB SAB Ect Mult Living   2         1     Past Medical History  Diagnosis Date  . No pertinent past medical history    Past Surgical History  Procedure Date  . No past surgeries    Family History: family history includes Depression in her father and Diabetes in her mother. Social History:  reports that she has never smoked. She does not have any smokeless tobacco history on file. She reports that she does not drink alcohol or use illicit drugs.   Prenatal Transfer Tool  Maternal Diabetes: No Genetic Screening: Normal quad screen Maternal Ultrasounds/Referrals: Normal Fetal Ultrasounds or other Referrals:  None Maternal Substance Abuse:  No Significant Maternal Medications:  None Significant Maternal Lab Results:  None Other Comments:  None  ROS: as above  Dilation: 9 Effacement (%): 100 Station: 0 Exam by:: L.Cresenzo, RN Blood pressure 124/71, pulse 91, temperature 98.6 F (37 C), temperature source Oral, resp. rate 20, height 5' 1.5" (1.562 m), weight 71.668 kg (158 lb), last menstrual period 07/01/2011, SpO2 100.00%. Exam Physical Exam  Prenatal labs: ABO, Rh: --/--/O POS, O POS (08/10 0255) Antibody: NEG (08/10 0255) Rubella: Immune (03/18 1200) RPR: Nonreactive (03/18 1200)  HBsAg: Negative (03/18 1200)  HIV: Non-reactive (03/18 1200)  GBS: Negative (03/18 1200)   Assessment/Plan: Admit Epidural  Expectant management  Kristen Baldwin H. 04/02/2012, 5:20 AM

## 2012-04-02 NOTE — Anesthesia Procedure Notes (Signed)
Epidural Patient location during procedure: OB Start time: 04/02/2012 3:32 AM Reason for block: procedure for pain  Staffing Performed by: anesthesiologist   Preanesthetic Checklist Completed: patient identified, site marked, surgical consent, pre-op evaluation, timeout performed, IV checked, risks and benefits discussed and monitors and equipment checked  Epidural Patient position: sitting Prep: site prepped and draped and DuraPrep Patient monitoring: continuous pulse ox and blood pressure Approach: midline Injection technique: LOR air  Needle:  Needle type: Other  Needle gauge: 17 G Needle length: 9 cm Needle insertion depth: 5 cm cm Catheter type: closed end flexible Catheter size: 19 Gauge Catheter at skin depth: 10 cm Test dose: negative  Assessment Events: blood not aspirated, injection not painful, no injection resistance, negative IV test and no paresthesia  Additional Notes Discussed risk of headache, infection, bleeding, nerve injury and failed or incomplete block.  Informed patient that given her advanced state of labor (9.5 cm dilated and membranes ruptured), that epidural may not have time to fully relieve pain.  Patient voices understanding and wishes to proceed.  CSE performed with administration of 15 mcg fentanyl into CSF.  Epidural then placed, test doses negative as above.  Jasmine December, MD

## 2012-04-03 LAB — CBC
HCT: 27.5 % — ABNORMAL LOW (ref 36.0–46.0)
Hemoglobin: 9 g/dL — ABNORMAL LOW (ref 12.0–15.0)
MCHC: 33.1 g/dL (ref 30.0–36.0)
RBC: 3.1 MIL/uL — ABNORMAL LOW (ref 3.87–5.11)

## 2012-04-03 MED ORDER — HYDROCODONE-ACETAMINOPHEN 5-500 MG PO TABS: 1.0000 | ORAL_TABLET | Freq: Four times a day (QID) | ORAL | Status: AC | PRN

## 2012-04-03 MED ORDER — DOCUSATE SODIUM 100 MG PO CAPS: 100.0000 mg | ORAL_CAPSULE | Freq: Two times a day (BID) | ORAL | Status: AC

## 2012-04-03 MED ORDER — IBUPROFEN 600 MG PO TABS: 600.0000 mg | ORAL_TABLET | Freq: Four times a day (QID) | ORAL | Status: AC | PRN

## 2012-04-03 NOTE — Anesthesia Postprocedure Evaluation (Signed)
  Anesthesia Post-op Note  Patient: Kristen Baldwin  Procedure(s) Performed: * No procedures listed *  Patient Location: Mother/Baby  Anesthesia Type: Epidural  Level of Consciousness: awake, alert  and oriented  Airway and Oxygen Therapy: Patient Spontanous Breathing  Post-op Pain: mild  Post-op Assessment: Patient's Cardiovascular Status Stable and Respiratory Function Stable  Post-op Vital Signs: stable  Complications: No apparent anesthesia complications

## 2012-04-03 NOTE — Discharge Summary (Signed)
Obstetric Discharge Summary Reason for Admission: onset of labor Prenatal Procedures: ultrasound Intrapartum Procedures: spontaneous vaginal delivery Postpartum Procedures: none Complications-Operative and Postpartum: 2nd degree perineal laceration Hemoglobin  Date Value Range Status  04/03/2012 9.0* 12.0 - 15.0 g/dL Final     DELTA CHECK NOTED     REPEATED TO VERIFY     HCT  Date Value Range Status  04/03/2012 27.5* 36.0 - 46.0 % Final    Physical Exam:  General: alert, cooperative and appears stated age 26: appropriate Uterine Fundus: firm  Discharge Diagnoses: Term Pregnancy-delivered  Discharge Information: Date: 04/03/2012 Activity: pelvic rest Diet: routine Medications: Ibuprofen, Colace and Vicodin Condition: improved Instructions: refer to practice specific booklet Discharge to: home Follow-up Information    Follow up with Almon Hercules., MD in 4 weeks. (For post-partum evaluation)    Contact information:   [address] [place] [address] [phone]          Newborn Data: Live born female  Birth Weight: 8 lb (3630 g) APGAR: 9, 9  Home with mother.  Sherie Dobrowolski H. 04/03/2012, 10:11 AM

## 2012-04-03 NOTE — Progress Notes (Signed)
Post Partum Day 1 Subjective: no complaints, up ad lib, voiding, tolerating PO and + flatus  Objective: Filed Vitals:   04/02/12 1200 04/02/12 1450 04/02/12 2032 04/03/12 0548  BP: 97/59 109/70 100/67 99/67  Pulse: 88 80 87 95  Temp: 97.3 F (36.3 C) 97.9 F (36.6 C) 97.8 F (36.6 C) 98 F (36.7 C)  TempSrc: Oral Oral Oral Oral  Resp: 16 16 18 18   Height:      Weight:      SpO2:        Physical Exam:  General: alert, cooperative and appears stated age Lochia: appropriate Uterine Fundus: firm   Basename 04/03/12 0530 04/02/12 0255  HGB 9.0* 11.9*  HCT 27.5* 36.2    Assessment/Plan: Discharge home and Breastfeeding Declines neonatal circ   LOS: 1 day   Eh Sesay H. 04/03/2012, 9:53 AM

## 2012-04-04 NOTE — Progress Notes (Signed)
Post discharge chart review completed.  

## 2014-06-25 ENCOUNTER — Encounter (HOSPITAL_COMMUNITY): Payer: Self-pay | Admitting: *Deleted

## 2014-11-30 ENCOUNTER — Encounter: Payer: Self-pay | Admitting: Internal Medicine

## 2014-11-30 ENCOUNTER — Ambulatory Visit: Payer: Medicaid Other | Attending: Internal Medicine | Admitting: Internal Medicine

## 2014-11-30 VITALS — BP 123/85 | HR 84 | Temp 98.0°F | Resp 16 | Ht 62.0 in | Wt 142.0 lb

## 2014-11-30 DIAGNOSIS — Z308 Encounter for other contraceptive management: Secondary | ICD-10-CM | POA: Insufficient documentation

## 2014-11-30 DIAGNOSIS — Z3046 Encounter for surveillance of implantable subdermal contraceptive: Secondary | ICD-10-CM

## 2014-11-30 NOTE — Patient Instructions (Signed)
Keep pressure dressing on for 24 hours to minimize bruising of wound. IF you begin to have increased swelling, pain, or discharge from the wound please come back to office.   Use condoms with all sexual encounters due to removal of birth control.

## 2014-11-30 NOTE — Progress Notes (Signed)
Patient ID: Kristen Baldwin, female   DOB: [birth year], 29 y.o.   MRN: [id number]   [id number]  [id number]  DOB - [birth year]  CC:  Chief Complaint  Patient presents with  . Establish Care       HPI: Kristen Baldwin is a 29 y.o. female here today to establish medical care.  She would like to have her Nexplanon removed today. She states that she is not interested in having replacement or other forms of birth control.  She has no past medical history. No chief complaint today.   Patient has No chest pain, No abdominal pain - No Nausea, No new weakness tingling or numbness, No Cough - SOB.  No Known Allergies Past Medical History  Diagnosis Date  . No pertinent past medical history    Current Outpatient Prescriptions on File Prior to Visit  Medication Sig Dispense Refill  . Prenatal Vit-Fe Fumarate-FA (PRENATAL MULTIVITAMIN) TABS Take 1 tablet by mouth every morning.     No current facility-administered medications on file prior to visit.   Family History  Problem Relation Age of Onset  . Diabetes Mother   . Depression Father    History   Social History  . Marital Status: Single    Spouse Name: N/A  . Number of Children: N/A  . Years of Education: N/A   Occupational History  . Not on file.   Social History Main Topics  . Smoking status: Never Smoker   . Smokeless tobacco: Not on file  . Alcohol Use: No  . Drug Use: No  . Sexual Activity: Not on file   Other Topics Concern  . Not on file   Social History Narrative    Review of Systems: See HPI    Objective:   Filed Vitals:   11/30/14 1608  BP: 123/85  Pulse: 84  Temp: 98 F (36.7 C)  Resp: 16   PROCEDURE NOTE: Explanon removal Patient given informed consent, signed copy in the chart.  Appropriate time out taken  The patient's  left  arm was prepped and draped in the usual sterile fashion. Local anaesthesia obtained using 2 cc of 1% lidocaine with epinephrine. Explanon was removed per  manufacturer's directions. Less than 3 cc blood loss. The removal site was closed with dermabond and a pressure bandage to minimize bruising. There were no complications and the patient tolerated the procedure well.  Patient decided not to have replacement at this time   Lab Results  Component Value Date   WBC 9.4 04/03/2012   HGB 9.0* 04/03/2012   HCT 27.5* 04/03/2012   MCV 88.7 04/03/2012   PLT 160 04/03/2012   No results found for: CREATININE, BUN, NA, K, CL, CO2  No results found for: HGBA1C Lipid Panel  No results found for: CHOL, TRIG, HDL, CHOLHDL, VLDL, LDLCALC     Assessment and plan:   Kristen Baldwin was seen today for establish care.  Diagnoses and all orders for this visit:  Encounter for Nexplanon removal Patient will follow up in week for a wound recheck    Return in about 1 week (around 12/07/2014) for Nurse Visit or me if no openings for wound recheck .      Holland CommonsKECK, Kristen Doane, NP-C [facility] [phone] 11/30/2014, 4:10 PM

## 2014-11-30 NOTE — Progress Notes (Signed)
Pt is here to establish care. Pt is her to take out her birth control implant.  Pt states that she just started having frequent headaches.

## 2014-12-04 ENCOUNTER — Emergency Department (HOSPITAL_COMMUNITY)
Admission: EM | Admit: 2014-12-04 | Discharge: 2014-12-04 | Disposition: A | Discharge status: Home / Self Care | Payer: 59 | Source: Home / Self Care | Attending: Family Medicine | Admitting: Family Medicine

## 2014-12-04 ENCOUNTER — Encounter (HOSPITAL_COMMUNITY): Payer: Self-pay | Admitting: Emergency Medicine

## 2014-12-04 DIAGNOSIS — Z4889 Encounter for other specified surgical aftercare: Secondary | ICD-10-CM

## 2014-12-04 DIAGNOSIS — S41102A Unspecified open wound of left upper arm, initial encounter: Secondary | ICD-10-CM

## 2014-12-04 MED ORDER — BACITRACIN ZINC 500 UNIT/GM EX OINT: 1.0000 "application " | TOPICAL_OINTMENT | Freq: Two times a day (BID) | CUTANEOUS | Status: DC

## 2014-12-04 NOTE — Discharge Instructions (Signed)
Please keep area clean and dry. Wash gently 2 x day with warm soapy water, pat dry, apply thin layer of bacitracin ointment and cover with dry dressing. Monitor closely for signs of infection. If you have additional concerns, [facility].   Incision Care An incision is when a surgeon cuts into your body tissues. After surgery, the incision needs to be cared for properly to prevent infection.  HOME CARE INSTRUCTIONS   Take all medicine as directed by your caregiver. Only take over-the-counter or prescription medicines for pain, discomfort, or fever as directed by your caregiver.  Do not remove your bandage (dressing) or get your incision wet until your surgeon gives you permission. In the event that your dressing becomes wet, dirty, or starts to smell, change the dressing and call your surgeon for instructions as soon as possible.  Take showers. Do not take tub baths, swim, or do anything that may soak the wound until it is healed.  Resume your normal diet and activities as directed or allowed.  Avoid lifting any weight until you are instructed otherwise.  Use anti-itch antihistamine medicine as directed by your caregiver. The wound may itch when it is healing. Do not pick or scratch at the wound.  Follow up with your caregiver for stitch (suture) or staple removal as directed.  Drink enough fluids to keep your urine clear or pale yellow. SEEK MEDICAL CARE IF:   You have redness, swelling, or increasing pain in the wound that is not controlled with medicine.  You have drainage, blood, or pus coming from the wound that lasts longer than 1 day.  You develop muscle aches, chills, or a general ill feeling.  You notice a bad smell coming from the wound or dressing.  Your wound edges separate after the sutures, staples, or skin adhesive strips have been removed.  You develop persistent nausea or vomiting. SEEK IMMEDIATE MEDICAL CARE IF:   You  have a fever.  You develop a rash.  You develop dizzy episodes or faint while standing.  You have difficulty breathing.  You develop any reaction or side effects to medicine given. MAKE SURE YOU:   Understand these instructions.  Will watch your condition.  Will get help right away if you are not doing well or get worse. Document Released: 02/27/2005 Document Revised: 11/02/2011 Document Reviewed: 10/04/2013 [facility] Patient Information 2015 [place], [place]. This information is not intended to replace advice given to you by your health care provider. Make sure you discuss any questions you have with your health care provider.

## 2014-12-04 NOTE — ED Notes (Addendum)
Implant placed Friday 4/8.  Site is painful, red. Site is upper left arm

## 2014-12-04 NOTE — ED Provider Notes (Signed)
CSN: [id number]     Arrival date & time 12/04/14  1337 History   First MD Initiated Contact with Patient 12/04/14 1418     Chief Complaint  Patient presents with  . Follow-up   (Consider location/radiation/quality/duration/timing/severity/associated sxs/prior Treatment) HPI Comments: Patient states she originally hand implant contraception device place at left upper arm a few years ago at [facility]. States she decided to have device (Nexplanon)  Removed on 11/30/2014 at [facility] as she felt implant to be causing headaches. Presents here stating she does not like the way her wound looks and she has experienced occasional burning sensation at removal site since device was removed. Cannot seem to explain why she did not return to clinic at which device was removed to discuss her concerns. Denies swelling, drainage, fever, redness at site or chills. Has been applying Neosporin to wound  The history is provided by the patient.    Past Medical History  Diagnosis Date  . No pertinent past medical history    Past Surgical History  Procedure Laterality Date  . No past surgeries     Family History  Problem Relation Age of Onset  . Diabetes Mother   . Depression Father    History  Substance Use Topics  . Smoking status: Never Smoker   . Smokeless tobacco: Not on file  . Alcohol Use: No   OB History    Gravida Para Term Preterm AB TAB SAB Ectopic Multiple Living   Review of Systems  All other systems reviewed and are negative.   Allergies  Review of patient's allergies indicates no known allergies.  Home Medications   Prior to Admission medications   Medication Sig Start Date End Date Taking? Authorizing Provider  bacitracin ointment Apply 1 application topically 2 (two) times daily. After washing wound gently with warm soapy water. Then cover with dry dressing 12/04/14   Ria Clock, PA  Prenatal Vit-Fe Fumarate-FA  (PRENATAL MULTIVITAMIN) TABS Take 1 tablet by mouth every morning.    Historical Provider, MD   BP 137/80 mmHg  Pulse 93  Temp(Src) 98.2 F (36.8 C) (Oral)  Resp 12  SpO2 100%  LMP 07/01/2014 Physical Exam  Constitutional: She is oriented to person, place, and time. She appears well-developed and well-nourished. No distress.  HENT:  Head: Normocephalic and atraumatic.  Cardiovascular: Normal rate, regular rhythm and normal heart sounds.   Musculoskeletal: Normal range of motion.  Neurological: She is alert and oriented to person, place, and time.  Skin: Skin is warm and dry. No rash noted. No erythema.     At surgical site, much of wound adhesive has peeled away. Slight separation of skin at both 1.5cm parallel incisions. No erythema, induration, tenderness or drainage.   Psychiatric: She has a normal mood and affect. Her behavior is normal.  Nursing note and vitals reviewed.   ED Course  Procedures (including critical care time) Labs Review Labs Reviewed - No data to display  Imaging Review No results found.   MDM   1. Encounter for postoperative wound check    Wound cleared of remaining flakes of wound adhesive. Cleaned with sterile saline. Thin layer of bacitracin and dry dressing. Advised patient regarding continued wound care at home and Rx for bacitracin provided. Advised to follow up at [facility] with any additional concerns. No clinical indications of wound infection.  Patient  asks if sutures should be placed to address skin separation and I advised that it would not be appropriate to place sutures in this 3 day old wound. Placement would only increase risk of infection and wound will heal by secondary intension.    Ria ClockJennifer Lee H Shammond Arave, GeorgiaPA 12/04/14 1517

## 2014-12-07 ENCOUNTER — Ambulatory Visit: Payer: 59 | Admitting: Internal Medicine

## 2015-10-30 ENCOUNTER — Ambulatory Visit (INDEPENDENT_AMBULATORY_CARE_PROVIDER_SITE_OTHER): Payer: BLUE CROSS/BLUE SHIELD | Admitting: Family Medicine

## 2015-10-30 ENCOUNTER — Encounter: Payer: Self-pay | Admitting: Family Medicine

## 2015-10-30 VITALS — BP 136/82 | HR 72 | Wt 138.4 lb

## 2015-10-30 DIAGNOSIS — Z7189 Other specified counseling: Secondary | ICD-10-CM

## 2015-10-30 DIAGNOSIS — Z202 Contact with and (suspected) exposure to infections with a predominantly sexual mode of transmission: Secondary | ICD-10-CM | POA: Diagnosis not present

## 2015-10-30 DIAGNOSIS — R894 Abnormal immunological findings in specimens from other organs, systems and tissues: Secondary | ICD-10-CM | POA: Diagnosis not present

## 2015-10-30 DIAGNOSIS — R768 Other specified abnormal immunological findings in serum: Secondary | ICD-10-CM

## 2015-10-30 DIAGNOSIS — Z7689 Persons encountering health services in other specified circumstances: Secondary | ICD-10-CM

## 2015-10-30 NOTE — Progress Notes (Signed)
   Subjective:    Patient ID: Kristen Baldwin, female    DOB: [birth year], 30 y.o.   MRN: [id number]  HPI Chief Complaint  Patient presents with  . 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    She is here to get a second opinion regarding recent blood work from an urgent care visit. She states she was having some vaginal discharge and asked to be tested for STDs. She states they called her yesterday and told her she was HSV1 and HSV2 positive and she does not understand what this means. She is tearful.  States she had negative urine pregnancy at the urgent care visit as well. She states she was told she had BV and is taking Flagyl for this.  She does not have results or records with her today.  She denies ever having a cold sore or outbreak to her genital area.  She denies fever, chills, body aches, rash, lesions, vaginal discharge, irritation or itching, nausea, vomiting.  States she has had 2 lifetime sexual partners.    Review of Systems Pertinent positives and negatives in the history of present illness.     Objective:   Physical Exam BP 136/82 mmHg  Pulse 72  Wt 138 lb 6.4 oz (62.778 kg)  Alert and oriented and in no acute distress. Not otherwise examined.       Assessment & Plan:  HSV-2 seropositive  Encounter to establish care  Possible exposure to STD  Awaiting lab results from [facility]. Explained HSV 1 and 2 to patient along with treatment. She is not currently having symptoms and per her recollection, she has never had an outbreak. Discussed that she would need to call if she notices pain, itching, burning or sores to her genital area and then she would need an antiviral at that point. Also discussed safe sex and possibility of spreading this to her sexual partner. Will call her when record is sent to me if results are different than what we discussed today.  She will follow up as needed otherwise.

## 2015-10-30 NOTE — Patient Instructions (Signed)
Cold Sore A cold sore (fever blister) is a skin infection caused by the herpes simplex virus (HSV-1). HSV-1 is closely related to the virus that causes genital herpes (HSV-2), but they are not the same even though both viruses can cause oral and genital infections. Cold sores are small, fluid-filled sores inside of the mouth or on the lips, gums, nose, chin, cheeks, or fingers.  The herpes simplex virus can be easily passed (contagious) to other people through close personal contact, such as kissing or sharing personal items. The virus can also spread to other parts of the body, such as the eyes or genitals. Cold sores are contagious until the sores crust over completely. They often heal within 2 weeks.  Once a person is infected, the herpes simplex virus remains permanently in the body. Therefore, there is no cure for cold sores, and they often recur when a person is tired, stressed, sick, or gets too much sun. Additional factors that can cause a recurrence include hormone changes in menstruation or pregnancy, certain drugs, and cold weather.  CAUSES  Cold sores are caused by the herpes simplex virus. The virus is spread from person to person through close contact, such as through kissing, touching the affected area, or sharing personal items such as lip balm, razors, or eating utensils.  SYMPTOMS  The first infection may not cause symptoms. If symptoms develop, the symptoms often go through different stages. Here is how a cold sore develops:   Tingling, itching, or burning is felt 1-2 days before the outbreak.   Fluid-filled blisters appear on the lips, inside the mouth, nose, or on the cheeks.   The blisters start to ooze clear fluid.   The blisters dry up and a yellow crust appears in its place.   The crust falls off.  Symptoms depend on whether it is the initial outbreak or a recurrence. Some other symptoms with the first outbreak may include:   Fever.   Sore throat.   Headache.    Muscle aches.   Swollen neck glands.  DIAGNOSIS  A diagnosis is often made based on your symptoms and looking at the sores. Sometimes, a sore may be swabbed and then examined in the lab to make a final diagnosis. If the sores are not present, blood tests can find the herpes simplex virus.  TREATMENT  There is no cure for cold sores and no vaccine for the herpes simplex virus. Within 2 weeks, most cold sores go away on their own without treatment. Medicines cannot make the infection go away, but medicine can help relieve some of the pain associated with the sores, can work to stop the virus from multiplying, and can also shorten healing time. Medicine may be in the form of creams, gels, pills, or a shot.  HOME CARE INSTRUCTIONS   Only take over-the-counter or prescription medicines for pain, discomfort, or fever as directed by your caregiver. Do not use aspirin.   Use a cotton-tip swab to apply creams or gels to your sores.   Do not touch the sores or pick the scabs. Wash your hands often. Do not touch your eyes without washing your hands first.   Avoid kissing, oral sex, and sharing personal items until sores heal.   Apply an ice pack on your sores for 10-15 minutes to ease any discomfort.   Avoid hot, cold, or salty foods because they may hurt your mouth. Eat a soft, bland diet to avoid irritating the sores. Use a straw to drink   if you have pain when drinking out of a glass.   Keep sores clean and dry to prevent an infection of other tissues.   Avoid the sun and limit stress if these things trigger outbreaks. If sun causes cold sores, apply sunscreen on the lips before being out in the sun.  SEEK MEDICAL CARE IF:   You have a fever or persistent symptoms for more than 2-3 days.   You have a fever and your symptoms suddenly get worse.   You have pus, not clear fluid, coming from the sores.   You have redness that is spreading.   You have pain or irritation in your  eye.   You get sores on your genitals.   Your sores do not heal within 2 weeks.   You have a weakened immune system.   You have frequent recurrences of cold sores.  MAKE SURE YOU:   Understand these instructions.  Will watch your condition.  Will get help right away if you are not doing well or get worse.   This information is not intended to replace advice given to you by your health care provider. Make sure you discuss any questions you have with your health care provider.   Document Released: 08/07/2000 Document Revised: 08/31/2014 Document Reviewed: 12/23/2011 Elsevier Interactive Patient Education 2016 Elsevier Inc.  Genital Herpes Genital herpes is a common sexually transmitted infection (STI) that is caused by a virus. The virus is spread from person to person through sexual contact. Infection can cause itching, blisters, and sores in the genital area or rectal area. This is called an outbreak. It affects both men and women. Genital herpes is particularly concerning for pregnant women because the virus can be passed to the baby during delivery and cause serious problems. Genital herpes is also a concern for people with a weakened defense (immune) system. Symptoms of genital herpes may last several days and then go away. However, the virus remains in your body, so you may have more outbreaks of symptoms in the future. The time between outbreaks varies and can be months or years. CAUSES Genital herpes is caused by a virus called herpes simplex virus (HSV) type 2 or HSV type 1. These viruses are contagious and are most often spread through sexual contact with an infected person. Sexual contact includes vaginal, anal, and oral sex. RISK FACTORS Risk factors for genital herpes include:  Being sexually active with multiple partners.  Having unprotected sex. SIGNS AND SYMPTOMS Symptoms may include:  Pain and itching in the genital area or rectal area.  Small red bumps  that turn into blisters and then turn into sores.  Flu-like symptoms, including:  Fever.  Body aches.  Painful urination.  Vaginal discharge. DIAGNOSIS Genital herpes may be diagnosed by:  Physical exam.  Blood test.  Fluid culture test from an open sore. TREATMENT There is no cure for genital herpes. Oral antiviral medicines may be used to speed up healing and to help prevent the return of symptoms. These medicines can also help to reduce the spread of the virus to sexual partners. HOME CARE INSTRUCTIONS  Keep the affected areas dry and clean.  Take medicines only as directed by your health care provider.  Do not have sexual contact during active infections. Genital herpes is contagious.  Practice safe sex. Latex condoms and female condoms may help to prevent the spread of the herpes virus.  Avoid rubbing or touching the blisters and sores. If you do touch the blister or  sores:  Wash your hands thoroughly.  Do not touch your eyes afterward.  If you become pregnant, tell your health care provider if you have had genital herpes.  Keep all follow-up visits as directed by your health care provider. This is important. PREVENTION  Use condoms. Although anyone can contract genital herpes during sexual contact even with the use of a condom, a condom can provide some protection.  Avoid having multiple sexual partners.  Talk to your sexual partner about any symptoms and past history that either of you may have.  Get tested before you have sex. Ask your partner to do the same.  Recognize the symptoms of genital herpes. Do not have sexual contact if you notice these symptoms. SEEK MEDICAL CARE IF:  Your symptoms are not improving with medicine.  Your symptoms return.  You have new symptoms.  You have a fever.  You have abdominal pain.  You have redness, swelling, or pain in your eye. MAKE SURE YOU:  Understand these instructions.  Will watch your  condition.  Will get help right away if you are not doing well or get worse.   This information is not intended to replace advice given to you by your health care provider. Make sure you discuss any questions you have with your health care provider.   Document Released: 08/07/2000 Document Revised: 08/31/2014 Document Reviewed: 12/26/2013 Elsevier Interactive Patient Education [employer]

## 2015-11-04 ENCOUNTER — Ambulatory Visit (INDEPENDENT_AMBULATORY_CARE_PROVIDER_SITE_OTHER): Payer: BLUE CROSS/BLUE SHIELD | Admitting: Physician Assistant

## 2015-11-04 VITALS — BP 120/80 | HR 100 | Temp 98.3°F | Resp 17 | Ht 63.0 in | Wt 139.0 lb

## 2015-11-04 DIAGNOSIS — J029 Acute pharyngitis, unspecified: Secondary | ICD-10-CM | POA: Diagnosis not present

## 2015-11-04 DIAGNOSIS — N898 Other specified noninflammatory disorders of vagina: Secondary | ICD-10-CM

## 2015-11-04 DIAGNOSIS — B009 Herpesviral infection, unspecified: Secondary | ICD-10-CM

## 2015-11-04 LAB — POCT WET + KOH PREP
TRICH BY WET PREP: ABSENT
YEAST BY KOH: ABSENT
YEAST BY WET PREP: ABSENT

## 2015-11-04 LAB — POCT URINE PREGNANCY: PREG TEST UR: NEGATIVE

## 2015-11-04 NOTE — Progress Notes (Signed)
[facility] [address], [place] [address] [phone]  Date:  11/04/2015   Name:  Kristen Baldwin   DOB:  Feb 26, 1986   MRN:  [id number]  PCP:  Hetty Blend, NP    Chief Complaint: Sore Throat; Follow-up; and OTHER   History of Present Illness:  This is a 30 y.o. female  who is presenting with sore throat x 5 days. Stayed the same since onset. Pain described as burning. Having dry cough. No nasal congestion, otalgia, fever, chills. Taking vicks and not helping. No hx allergies or asthma Not a smoker.  1 week ago went to another UC for vaginal discharge. Dx'd with BV. Treated with flagyl. She was told to come back for repeat testing to make sure infection is gone. Had a little spotting yesterday and she wants to make sure the infection is gone. LMP 10/12/15.  She is also wanting a 2nd opinion on a positive HSV test. While she was at the at [facility] she had full STD testing. She was called and told she has HSV 1 and 2. She has never had an outbreak. She has only had 2 lifetime partners. 1st partner was her children's father... They were together 6-7 years. She left him because he was cheating on her. She is with a new partner now. Her current partner went to get tested for STDs in the past week and he was negative for HSV. She is wondering if she should get tested again to make sure it wasn't a false positive.  Review of Systems:  Review of Systems See HPI  There are no active problems to display for this patient.   Prior to Admission medications   Medication Sig Start Date End Date Taking? Authorizing Provider  metroNIDAZOLE (FLAGYL) 500 MG tablet Take 500 mg by mouth 2 (two) times daily. Reported on 11/04/2015    Historical Provider, MD    No Known Allergies  Past Surgical History  Procedure Laterality Date  . No past surgeries      Social History  Substance Use Topics  . Smoking status: Never Smoker   . Smokeless tobacco: None  . Alcohol Use: No     Family History  Problem Relation Age of Onset  . Diabetes Mother   . Depression Father     Medication list has been reviewed and updated.  Physical Examination:  Physical Exam  Constitutional: She is oriented to person, place, and time. She appears well-developed and well-nourished. No distress.  HENT:  Head: Normocephalic and atraumatic.  Right Ear: Hearing, tympanic membrane, external ear and ear canal normal.  Left Ear: Hearing, tympanic membrane, external ear and ear canal normal.  Nose: Nose normal.  Mouth/Throat: Uvula is midline and mucous membranes are normal. Posterior oropharyngeal erythema (mild) present. No oropharyngeal exudate or posterior oropharyngeal edema.  Eyes: Conjunctivae and lids are normal. Right eye exhibits no discharge. Left eye exhibits no discharge. No scleral icterus.  Cardiovascular: Normal rate, regular rhythm, normal heart sounds and normal pulses.   No murmur heard. Pulmonary/Chest: Effort normal and breath sounds normal. No respiratory distress. She has no wheezes. She has no rhonchi. She has no rales.  Abdominal: Soft. Normal appearance. There is no tenderness.  Genitourinary: Uterus normal. There is no lesion on the right labia. There is no lesion on the left labia. Cervix exhibits no motion tenderness, no discharge and no friability. Right adnexum displays no tenderness and no fullness. Left adnexum displays no tenderness and no fullness.  Vaginal discharge (minimal, blood tinged) found.  Musculoskeletal: Normal range of motion.  Lymphadenopathy:       Head (right side): No submental, no submandibular and no tonsillar adenopathy present.       Head (left side): No submental, no submandibular and no tonsillar adenopathy present.    She has no cervical adenopathy.  Neurological: She is alert and oriented to person, place, and time.  Skin: Skin is warm, dry and intact. No lesion and no rash noted.  Psychiatric: She has a normal mood and affect.  Her speech is normal and behavior is normal. Thought content normal.   BP 120/80 mmHg  Pulse 100  Temp(Src) 98.3 F (36.8 C) (Oral)  Resp 17  Ht 5\' 3"  (1.6 m)  Wt 139 lb (63.05 kg)  BMI 24.63 kg/m2  SpO2 99%  Results for orders placed or performed in visit on 11/04/15  POCT Wet + KOH Prep  Result Value Ref Range   Yeast by KOH Absent Present, Absent   Yeast by wet prep Absent Present, Absent   WBC by wet prep Moderate (A) None, Few, Too numerous to count   Clue Cells Wet Prep HPF POC None None, Too numerous to count   Trich by wet prep Absent Present, Absent   Bacteria Wet Prep HPF POC Few None, Few, Too numerous to count   Epithelial Cells By [employer]et Pref ([facility]) Many (A) None, Few, Too numerous to count   RBC,UR,HPF,POC None None RBC/hpf  POCT urine pregnancy  Result Value Ref Range   Preg Test, Ur Negative Negative   Assessment and Plan:  1. Vaginal discharge No evidence BV on wet prep. Discussed proper hygiene and prevention of BV. - POCT Wet + KOH Prep - POCT urine pregnancy  2. HSV infection I do not feel another test for HSV would be beneficial. She understands this after a long discussion. We discussed prognosis and importance of safe sex. I answered all of patient's questions and she seems more reassured at end of visit.  3. Sore throat Likely viral etiology. Discussed supportive care. Return in 7 days if symptoms do not improve or at any time if symptoms worsen.    Roswell MinersNicole V. Dyke BrackettBush, PA-C, MHS [facility]  11/04/2015

## 2015-11-04 NOTE — Patient Instructions (Addendum)
Drink plenty of water (64 oz/day) and get plenty of rest. Ibuprofen and tylenol can help the pain. Chloraseptic spray to numb your throat. Cough drops help soothe your throat If your symptoms are not improving in 1 week, return to clinic.    IF you received an x-ray today, you will receive an invoice from [facility]. Please contact [facility] at [phone] with questions or concerns regarding your invoice.   IF you received labwork today, you will receive an invoice from [facility]. Please contact [facility] at [phone] with questions or concerns regarding your invoice.   Our billing staff will not be able to assist you with questions regarding bills from these companies.  You will be contacted with the lab results as soon as they are available. The fastest way to get your results is to activate your My Chart account. Instructions are located on the last page of this paperwork. If you have not heard from us regarding the results in 2 weeks, please contact this office.

## 2015-11-08 ENCOUNTER — Telehealth: Payer: Self-pay | Admitting: Internal Medicine

## 2015-11-08 NOTE — Telephone Encounter (Signed)
Called and left message for pt to call me or vickie, NP back as we have some updated info for her.. (in regards to HSV infection)

## 2015-11-11 NOTE — Telephone Encounter (Signed)
Kristen Baldwin spoke with patient about results

## 2015-12-16 ENCOUNTER — Encounter: Payer: Self-pay | Admitting: Family Medicine

## 2016-02-08 ENCOUNTER — Emergency Department (HOSPITAL_COMMUNITY)
Admission: EM | Admit: 2016-02-08 | Discharge: 2016-02-08 | Disposition: A | Discharge status: Home / Self Care | Payer: Self-pay | Attending: Emergency Medicine | Admitting: Emergency Medicine

## 2016-02-08 ENCOUNTER — Encounter (HOSPITAL_COMMUNITY): Payer: Self-pay | Admitting: *Deleted

## 2016-02-08 DIAGNOSIS — Y999 Unspecified external cause status: Secondary | ICD-10-CM | POA: Insufficient documentation

## 2016-02-08 DIAGNOSIS — S61217A Laceration without foreign body of left little finger without damage to nail, initial encounter: Secondary | ICD-10-CM | POA: Insufficient documentation

## 2016-02-08 DIAGNOSIS — W274XXA Contact with kitchen utensil, initial encounter: Secondary | ICD-10-CM | POA: Insufficient documentation

## 2016-02-08 DIAGNOSIS — Y939 Activity, unspecified: Secondary | ICD-10-CM | POA: Insufficient documentation

## 2016-02-08 DIAGNOSIS — Y929 Unspecified place or not applicable: Secondary | ICD-10-CM | POA: Insufficient documentation

## 2016-02-08 DIAGNOSIS — S61219A Laceration without foreign body of unspecified finger without damage to nail, initial encounter: Secondary | ICD-10-CM

## 2016-02-08 MED ORDER — TETANUS-DIPHTH-ACELL PERTUSSIS 5-2.5-18.5 LF-MCG/0.5 IM SUSP
0.5000 mL | Freq: Once | INTRAMUSCULAR | Status: AC
  Administered 2016-02-08: 0.5 mL via INTRAMUSCULAR
  Filled 2016-02-08: qty 0.5

## 2016-02-08 MED ORDER — LIDOCAINE HCL (PF) 1 % IJ SOLN
5.0000 mL | Freq: Once | INTRAMUSCULAR | Status: AC
  Administered 2016-02-08: 5 mL
  Filled 2016-02-08: qty 5

## 2016-02-08 NOTE — ED Notes (Signed)
Applied Bacitracin to wound, covered with band-aid and applied a finger splint, per Rob - PA

## 2016-02-08 NOTE — Discharge Instructions (Signed)

## 2016-02-08 NOTE — ED Provider Notes (Signed)
CSN: [id number]     Arrival date & time 02/08/16  2008 History  By signing my name below, I, Evon Slackerrance Branch, attest that this documentation has been prepared under the direction and in the presence of Roxy Horsemanobert Rechel Delosreyes, PA-C. Electronically Signed: Evon Slackerrance Branch, ED Scribe. 02/08/2016. 8:25 PM.      Chief Complaint  Patient presents with  . Laceration    Patient is a 30 y.o. female presenting with skin laceration. The history is provided by the patient. No language interpreter was used.  Laceration  HPI Comments: Kristen Baldwin is a 30 y.o. female who presents to the Emergency Department complaining of new laceration to left 5th digit 30 minutes PTA. Pt states that she accidentally cut her self with a can opener. Pt states that bleeding is controlled. Pt doesn't report numbness or tingling.   Past Medical History  Diagnosis Date  . No pertinent past medical history    Past Surgical History  Procedure Laterality Date  . No past surgeries     Family History  Problem Relation Age of Onset  . Diabetes Mother   . Depression Father    Social History  Substance Use Topics  . Smoking status: Never Smoker   . Smokeless tobacco: None  . Alcohol Use: No   OB History    Gravida Para Term Preterm AB TAB SAB Ectopic Multiple Living   2 1 1       2      Review of Systems  Skin: Positive for wound.  Neurological: Negative for numbness.     Allergies  Review of patient's allergies indicates no known allergies.  Home Medications   Prior to Admission medications   Medication Sig Start Date End Date Taking? Authorizing Provider  metroNIDAZOLE (FLAGYL) 500 MG tablet Take 500 mg by mouth 2 (two) times daily. Reported on 11/04/2015    Historical Provider, MD   BP 124/90 mmHg  Pulse 77  Temp(Src) 98.1 F (36.7 C) (Oral)  Resp 16  SpO2 100%   Physical Exam  Constitutional: She is oriented to person, place, and time. She appears well-developed and well-nourished. No distress.   HENT:  Head: Normocephalic and atraumatic.  Eyes: Conjunctivae and EOM are normal.  Neck: Neck supple. No tracheal deviation present.  Cardiovascular: Normal rate.   Pulmonary/Chest: Effort normal. No respiratory distress.  Musculoskeletal: Normal range of motion.  ROM and strength 5/5 isolated at all joints of left little finger  Neurological: She is alert and oriented to person, place, and time.  Skin: Skin is warm and dry.  0.5 cm linear laceration to palmar aspect of the left 5th finger over the middle phalanx   Psychiatric: She has a normal mood and affect. Her behavior is normal.  Nursing note and vitals reviewed.   ED Course  Procedures (including critical care time) DIAGNOSTIC STUDIES: Oxygen Saturation is 100% on RA, normal by my interpretation.    COORDINATION OF CARE: 8:26 PM-Discussed treatment plan with pt at bedside and pt agreed to plan.   LACERATION REPAIR Performed by: Roxy HorsemanBROWNING, Briele Lagasse Authorized by: Roxy HorsemanBROWNING, Cypress Hinkson Consent: Verbal consent obtained. Risks and benefits: risks, benefits and alternatives were discussed Consent given by: patient Patient identity confirmed: provided demographic data Prepped and Draped in normal sterile fashion Wound explored  Laceration Location: left little finger  Laceration Length: 1 cm  No Foreign Bodies seen or palpated  Anesthesia: local infiltration  Local anesthetic: lidocaine 2% without epinephrine  Anesthetic total: 1 ml  Irrigation method: syringe Amount of  cleaning: standard  Skin closure: 5-0 vicryl plus  Number of sutures: 2  Technique: interrupted  Patient tolerance: Patient tolerated the procedure well with no immediate complications.    MDM   Final diagnoses:  Finger laceration, initial encounter    Small laceration to little finger.  No tendon or ligament involvement.  Normal strength.  Repaired with suture.  Tdap updated.  I personally performed the services described in this  documentation, which was scribed in my presence. The recorded information has been reviewed and is accurate.        Roxy Horseman, PA-C 02/08/16 2103  Arby Barrette, MD 02/09/16 [phone]

## 2016-02-08 NOTE — ED Notes (Signed)
The pt mp mondaywas opening a can when she cut her lt little finger.  No active bleeding at present  lmp monday

## 2018-08-24 NOTE — L&D Delivery Note (Signed)
Delivery Note Pt pushed very well after cervix reduced for delivery.  At 3:14 AM a viable and healthy female was delivered via Vaginal, Spontaneous (Presentation: OA ).  APGAR: 8, 9; weight P .   Placenta status: delivered, intact .  Cord: 3V with the following complications: nuchal x 1.   Anesthesia:  epidural Episiotomy: None Lacerations:  2nd degree Suture Repair: 3.0 vicryl Est. Blood Loss (mL): 133cc  Mom to postpartum.  Baby to Couplet care / Skin to Skin.  Yuli Lanigan Bovard-Stuckert 05/08/2019, 4:15 AM  O+/RI/VNI/Contra?/Tdap in [facility]

## 2018-08-28 ENCOUNTER — Emergency Department (HOSPITAL_COMMUNITY)
Admission: EM | Admit: 2018-08-28 | Discharge: 2018-08-29 | Disposition: A | Discharge status: Home / Self Care | Payer: BLUE CROSS/BLUE SHIELD | Attending: Emergency Medicine | Admitting: Emergency Medicine

## 2018-08-28 DIAGNOSIS — R51 Headache: Secondary | ICD-10-CM | POA: Insufficient documentation

## 2018-08-28 DIAGNOSIS — R519 Headache, unspecified: Secondary | ICD-10-CM

## 2018-08-28 HISTORY — DX: Headache, unspecified: R51.9

## 2018-08-28 HISTORY — DX: Headache: R51

## 2018-08-29 ENCOUNTER — Other Ambulatory Visit: Payer: Self-pay

## 2018-08-29 ENCOUNTER — Encounter (HOSPITAL_COMMUNITY): Payer: Self-pay | Admitting: Emergency Medicine

## 2018-08-29 LAB — I-STAT BETA HCG BLOOD, ED (MC, WL, AP ONLY): I-stat hCG, quantitative: 706.3 m[IU]/mL — ABNORMAL HIGH (ref <5)

## 2018-08-29 NOTE — ED Notes (Signed)
Patient Alert and oriented to baseline. Stable and ambulatory to baseline. Patient verbalized understanding of the discharge instructions.  Patient belongings were taken by the patient.   

## 2018-08-29 NOTE — ED Provider Notes (Signed)
[facility] EMERGENCY DEPARTMENT Provider Note   CSN: [id number] Arrival date & time: 08/28/18  2338     History   Chief Complaint Chief Complaint  Patient presents with  . Headache    HPI Kristen Baldwin is a 33 y.o. female.  Patient is a 33 year old female with no significant past medical history.  She presents today for evaluation of headache.  This began at approximately 5:00 this afternoon.  She describes pressure to the front of her head radiating to the back.  There are no associated visual disturbances, weakness, numbness, neck pain, or fever.  She took Tylenol with some relief and was able to take a nap, however headache returned shortly upon wakening.  She took additional Tylenol and the headache persists.  The history is provided by the patient.  Headache   This is a new problem. Episode onset: 5 PM. The problem occurs constantly. The problem has not changed since onset.The headache is associated with nothing. The pain is located in the frontal region. The quality of the pain is described as dull. The pain is moderate. Pertinent negatives include no fever, no malaise/fatigue, no nausea and no vomiting. She has tried acetaminophen for the symptoms. The treatment provided mild relief.    Past Medical History:  Diagnosis Date  . Headache   . No pertinent past medical history     Patient Active Problem List   Diagnosis Date Noted  . 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 NO PAST SURGERIES       OB History    Gravida  2   Para  1   Term  1   Preterm      AB      Living  2     SAB      TAB      Ectopic      Multiple      Live Births  1            Home Medications    Prior to Admission medications   Medication Sig Start Date End Date Taking? Authorizing Provider  metroNIDAZOLE (FLAGYL) 500 MG tablet Take 500 mg by mouth 2 (two) times daily. Reported on 11/04/2015    [provider]    Family History Family History  Problem Relation Age of Onset  . Diabetes Mother   . Depression Father     Social History Social History   Tobacco Use  . Smoking status: Never Smoker  . Smokeless tobacco: Never Used  Substance Use Topics  . Alcohol use: No  . Drug use: No     Allergies   Patient has no known allergies.   Review of Systems Review of Systems  Constitutional: Negative for fever and malaise/fatigue.  Gastrointestinal: Negative for nausea and vomiting.  Neurological: Positive for headaches.  All other systems reviewed and are negative.    Physical Exam Updated Vital Signs BP 136/87 (BP Location: Right Arm)   Pulse 84   Temp 98 F (36.7 C) (Oral)   Resp 16   Ht 5\' 2"  (1.575 m)   Wt 67.6 kg   LMP 08/02/2018   SpO2 100%   BMI 27.25 kg/m   Physical Exam Vitals signs and nursing note reviewed.  Constitutional:      General: She is not in acute distress.    Appearance: She is well-developed. She is not diaphoretic.  HENT:     Head:  Normocephalic and atraumatic.  Eyes:     General: No visual field deficit.    Extraocular Movements: Extraocular movements intact.     Pupils: Pupils are equal, round, and reactive to light.  Neck:     Musculoskeletal: Normal range of motion and neck supple.  Cardiovascular:     Rate and Rhythm: Normal rate and regular rhythm.     Heart sounds: No murmur. No friction rub. No gallop.   Pulmonary:     Effort: Pulmonary effort is normal. No respiratory distress.     Breath sounds: Normal breath sounds. No wheezing.  Abdominal:     General: Bowel sounds are normal. There is no distension.     Palpations: Abdomen is soft.     Tenderness: There is no abdominal tenderness.  Musculoskeletal: Normal range of motion.  Skin:    General: Skin is warm and dry.  Neurological:     Mental Status: She is alert and oriented to person, place, and time.     Cranial Nerves: No cranial nerve deficit, dysarthria  or facial asymmetry.     Motor: No weakness.     Coordination: Coordination normal.      ED Treatments / Results  Labs (all labs ordered are listed, but only abnormal results are displayed) Labs Reviewed  I-STAT BETA HCG BLOOD, ED ([facility], [facility], [facility] ONLY)    EKG None  Radiology No results found.  Procedures Procedures (including critical care time)  Medications Ordered in ED Medications - No data to display   Initial Impression / Assessment and Plan / ED Course  I have reviewed the triage vital signs and the nursing notes.  Pertinent labs & imaging results that were available during my care of the patient were reviewed by me and considered in my medical decision making (see chart for details).  Patient presents with complaints of headache that started approximately 5 PM.  It improved with Tylenol, then returned later in the evening.  Her neurologic exam is nonfocal and she appears clinically well.  She does have a positive pregnancy test.  I see no indication for imaging studies and also feel as though additional medications may be harmful to the pregnancy.  Patient appears appropriate for discharge with continued Tylenol and follow-up as needed.  Final Clinical Impressions(s) / ED Diagnoses   Final diagnoses:  None    ED Discharge Orders    None       Geoffery Lyons, MD 08/29/18 [phone]

## 2018-08-29 NOTE — Discharge Instructions (Addendum)
Tylenol 1000 mg every 6 hours as needed for pain.  Return to the ER if symptoms significantly worsen or change.

## 2018-08-29 NOTE — ED Triage Notes (Signed)
Reports frontal headache and neck pain since 5pm.  Took Tylenol and went to bed.  States headache resolved but started again around 7pm.  Reports history of headaches.  No neuro deficits noted on triage exam.

## 2018-11-11 LAB — OB RESULTS CONSOLE ABO/RH: RH Type: POSITIVE

## 2018-11-11 LAB — OB RESULTS CONSOLE RPR: RPR: NONREACTIVE

## 2018-11-11 LAB — OB RESULTS CONSOLE ANTIBODY SCREEN: Antibody Screen: NEGATIVE

## 2018-11-11 LAB — OB RESULTS CONSOLE HEPATITIS B SURFACE ANTIGEN: Hepatitis B Surface Ag: NEGATIVE

## 2018-11-11 LAB — OB RESULTS CONSOLE HIV ANTIBODY (ROUTINE TESTING): HIV: NONREACTIVE

## 2018-11-11 LAB — OB RESULTS CONSOLE GC/CHLAMYDIA
Chlamydia: NEGATIVE
Gonorrhea: NEGATIVE

## 2018-11-11 LAB — OB RESULTS CONSOLE RUBELLA ANTIBODY, IGM: Rubella: IMMUNE

## 2019-03-25 ENCOUNTER — Telehealth: Payer: Self-pay

## 2019-03-25 DIAGNOSIS — Z20822 Contact with and (suspected) exposure to covid-19: Secondary | ICD-10-CM

## 2019-03-25 NOTE — Telephone Encounter (Signed)
Pt called wanting to get test for covid 19- pt advised to go to [facility] for testing. Order placed.

## 2019-04-18 ENCOUNTER — Encounter (HOSPITAL_COMMUNITY): Payer: Self-pay | Admitting: *Deleted

## 2019-04-18 ENCOUNTER — Telehealth (HOSPITAL_COMMUNITY): Payer: Self-pay | Admitting: *Deleted

## 2019-04-18 NOTE — Telephone Encounter (Signed)
Preadmission screen  

## 2019-04-19 ENCOUNTER — Telehealth (HOSPITAL_COMMUNITY): Payer: Self-pay | Admitting: *Deleted

## 2019-04-19 ENCOUNTER — Encounter (HOSPITAL_COMMUNITY): Payer: Self-pay | Admitting: *Deleted

## 2019-04-19 NOTE — Telephone Encounter (Signed)
Preadmission screen  

## 2019-04-30 ENCOUNTER — Other Ambulatory Visit (HOSPITAL_COMMUNITY)
Admission: RE | Admit: 2019-04-30 | Discharge: 2019-04-30 | Disposition: A | Discharge status: Home / Self Care | Payer: Medicaid Other | Source: Ambulatory Visit | Attending: Obstetrics and Gynecology | Admitting: Obstetrics and Gynecology

## 2019-04-30 ENCOUNTER — Other Ambulatory Visit: Payer: Self-pay

## 2019-04-30 DIAGNOSIS — Z01812 Encounter for preprocedural laboratory examination: Secondary | ICD-10-CM | POA: Diagnosis present

## 2019-04-30 DIAGNOSIS — U071 COVID-19: Secondary | ICD-10-CM | POA: Diagnosis not present

## 2019-04-30 HISTORY — DX: Anxiety disorder, unspecified: F41.9

## 2019-04-30 LAB — SARS CORONAVIRUS 2 (TAT 6-24 HRS): SARS Coronavirus 2: POSITIVE — AB

## 2019-04-30 NOTE — MAU Note (Signed)
Pt here for PAT covid swab, denies symptoms. Swab collected. 

## 2019-05-01 ENCOUNTER — Other Ambulatory Visit: Payer: Self-pay | Admitting: Obstetrics and Gynecology

## 2019-05-02 ENCOUNTER — Inpatient Hospital Stay (HOSPITAL_COMMUNITY): Payer: Medicaid Other

## 2019-05-04 ENCOUNTER — Telehealth (HOSPITAL_COMMUNITY): Payer: Self-pay | Admitting: *Deleted

## 2019-05-04 NOTE — Telephone Encounter (Signed)
Preadmission screen  

## 2019-05-07 ENCOUNTER — Other Ambulatory Visit: Payer: Self-pay

## 2019-05-07 ENCOUNTER — Encounter (HOSPITAL_COMMUNITY): Payer: Self-pay

## 2019-05-07 ENCOUNTER — Inpatient Hospital Stay (HOSPITAL_COMMUNITY)
Admission: AD | Admit: 2019-05-07 | Discharge: 2019-05-09 | DRG: 805 | Disposition: A | Discharge status: Home / Self Care | Payer: Medicaid Other | Attending: Obstetrics and Gynecology | Admitting: Obstetrics and Gynecology

## 2019-05-07 DIAGNOSIS — O9852 Other viral diseases complicating childbirth: Secondary | ICD-10-CM | POA: Diagnosis present

## 2019-05-07 DIAGNOSIS — Z23 Encounter for immunization: Secondary | ICD-10-CM

## 2019-05-07 DIAGNOSIS — Z3A39 39 weeks gestation of pregnancy: Secondary | ICD-10-CM | POA: Diagnosis not present

## 2019-05-07 DIAGNOSIS — O26893 Other specified pregnancy related conditions, third trimester: Secondary | ICD-10-CM | POA: Diagnosis present

## 2019-05-07 DIAGNOSIS — U071 COVID-19: Secondary | ICD-10-CM | POA: Diagnosis present

## 2019-05-07 MED ORDER — SOD CITRATE-CITRIC ACID 500-334 MG/5ML PO SOLN: 30.0000 mL | ORAL | Status: DC | PRN

## 2019-05-07 MED ORDER — OXYTOCIN 40 UNITS IN NORMAL SALINE INFUSION - SIMPLE MED
1.0000 m[IU]/min | INTRAVENOUS | Status: DC
  Administered 2019-05-08: 2 m[IU]/min via INTRAVENOUS

## 2019-05-07 MED ORDER — FENTANYL-BUPIVACAINE-NACL 0.5-0.125-0.9 MG/250ML-% EP SOLN
12.0000 mL/h | EPIDURAL | Status: DC | PRN
  Filled 2019-05-07: qty 250

## 2019-05-07 MED ORDER — OXYTOCIN 40 UNITS IN NORMAL SALINE INFUSION - SIMPLE MED
2.5000 [IU]/h | INTRAVENOUS | Status: DC
  Administered 2019-05-08: 2.5 [IU]/h via INTRAVENOUS
  Filled 2019-05-07: qty 1000

## 2019-05-07 MED ORDER — PHENYLEPHRINE 40 MCG/ML (10ML) SYRINGE FOR IV PUSH (FOR BLOOD PRESSURE SUPPORT): 80.0000 ug | PREFILLED_SYRINGE | INTRAVENOUS | Status: DC | PRN

## 2019-05-07 MED ORDER — ONDANSETRON HCL 4 MG/2ML IJ SOLN: 4.0000 mg | Freq: Four times a day (QID) | INTRAMUSCULAR | Status: DC | PRN

## 2019-05-07 MED ORDER — OXYCODONE-ACETAMINOPHEN 5-325 MG PO TABS: 1.0000 | ORAL_TABLET | ORAL | Status: DC | PRN

## 2019-05-07 MED ORDER — OXYCODONE-ACETAMINOPHEN 5-325 MG PO TABS: 2.0000 | ORAL_TABLET | ORAL | Status: DC | PRN

## 2019-05-07 MED ORDER — EPHEDRINE 5 MG/ML INJ: 10.0000 mg | INTRAVENOUS | Status: DC | PRN

## 2019-05-07 MED ORDER — TERBUTALINE SULFATE 1 MG/ML IJ SOLN: 0.2500 mg | Freq: Once | INTRAMUSCULAR | Status: DC | PRN

## 2019-05-07 MED ORDER — LACTATED RINGERS IV SOLN: INTRAVENOUS | Status: DC

## 2019-05-07 MED ORDER — DIPHENHYDRAMINE HCL 50 MG/ML IJ SOLN: 12.5000 mg | INTRAMUSCULAR | Status: DC | PRN

## 2019-05-07 MED ORDER — OXYTOCIN BOLUS FROM INFUSION
500.0000 mL | Freq: Once | INTRAVENOUS | Status: AC
  Administered 2019-05-08: 500 mL via INTRAVENOUS

## 2019-05-07 MED ORDER — BUTORPHANOL TARTRATE 1 MG/ML IJ SOLN: 1.0000 mg | INTRAMUSCULAR | Status: DC | PRN

## 2019-05-07 MED ORDER — LACTATED RINGERS IV SOLN: 500.0000 mL | Freq: Once | INTRAVENOUS | Status: DC

## 2019-05-07 MED ORDER — ACETAMINOPHEN 325 MG PO TABS
650.0000 mg | ORAL_TABLET | ORAL | Status: DC | PRN
  Administered 2019-05-08: 650 mg via ORAL
  Filled 2019-05-07: qty 2

## 2019-05-07 MED ORDER — LIDOCAINE HCL (PF) 1 % IJ SOLN: 30.0000 mL | INTRAMUSCULAR | Status: DC | PRN

## 2019-05-07 MED ORDER — LACTATED RINGERS IV SOLN: 500.0000 mL | INTRAVENOUS | Status: DC | PRN

## 2019-05-07 NOTE — H&P (Signed)
Kristen Baldwin is a 33 y.o. female G3P2002 at 39+ in labor.  6cm dilated in MAU.  Diagnosed covid + 9/8.  Pregnancy also complicated by anxiety.  Tdap in [facility].  Normal prenatal screening - nl first trimester screen.      OB History    Gravida  3   Para  2   Term  2   Preterm      AB      Living  2     SAB      TAB      Ectopic      Multiple      Live Births  2         SVD x 2; G1 SVD female 7#14 G2 SVD 8#, female  No abn pap  10/19 No STD Past Medical History:  Diagnosis Date  . Anxiety   . Headache   . No pertinent past medical history   COVID +  Past Surgical History:  Procedure Laterality Date  . NO PAST SURGERIES     Family History: family history includes Depression in her father; Diabetes in her maternal grandmother and mother. Social History:  reports that she has never smoked. She has never used smokeless tobacco. She reports that she does not drink alcohol or use drugs.single  Meds PNV All NKDA     Maternal Diabetes: No Genetic Screening: Normal Maternal Ultrasounds/Referrals: Normal Fetal Ultrasounds or other Referrals:  None Maternal Substance Abuse:  No Significant Maternal Medications:  None Significant Maternal Lab Results:  Group B Strep negative Other Comments:  COVID +  Review of Systems  Constitutional: Positive for malaise/fatigue.  Eyes: Negative.   Respiratory: Negative.   Cardiovascular: Negative.   Gastrointestinal: Negative.   Genitourinary: Negative.   Musculoskeletal: Positive for myalgias.  Skin: Negative.   Neurological: Negative.   Psychiatric/Behavioral: Negative.    Maternal Medical History:  Reason for admission: Contractions.   Contractions: Onset was 3-5 hours ago.   Frequency: regular.   Perceived severity is moderate.    Fetal activity: Perceived fetal activity is normal.    Prenatal complications: COVID +  Prenatal Complications - Diabetes: none.    Dilation: 6 Effacement (%): 80 Exam by:: Denyse Dago, RN Blood pressure 129/89, pulse (!) 108, temperature 98.1 F (36.7 C), temperature source Oral, resp. rate 20, last menstrual period 08/02/2018, SpO2 100 %. Maternal Exam:  Uterine Assessment: Contraction frequency is regular.   Abdomen: Patient reports no abdominal tenderness. Fundal height is appropriate for gestation.   Estimated fetal weight is 7.5 - 8.5#.   Fetal presentation: vertex  Introitus: Normal vulva. Normal vagina.    Physical Exam  Constitutional: She is oriented to person, place, and time. She appears well-developed and well-nourished.  HENT:  Head: Normocephalic and atraumatic.  Cardiovascular: Normal rate and regular rhythm.  Respiratory: Effort normal and breath sounds normal. No respiratory distress. She has no wheezes.  GI: Soft. Bowel sounds are normal. She exhibits no distension. There is no abdominal tenderness.  Genitourinary:    Vulva normal.   Musculoskeletal: Normal range of motion.  Neurological: She is alert and oriented to person, place, and time.  Skin: Skin is warm and dry.  Psychiatric: She has a normal mood and affect. Her behavior is normal.    Prenatal labs: ABO, Rh: O/Positive/-- (03/20 0000) Antibody: Negative (03/20 0000) Rubella: Immune (03/20 0000) RPR: Nonreactive (03/20 0000)  HBsAg: Negative (03/20 0000)  HIV: Non-reactive (03/20 0000)  GBS:   neg  Tdap 6/29 VNI, Hgb 12.4/Plt 286/Ur Cx neg/ Chl neg/GC neg/Hemoglobin electrophoresis WNL/ First tri Scr WNL/Varicella nonimmune  Nl NT, nl anat US   Assessment/Plan: 33yo G3P2002 at 39+ with COVID + in labor Admit to L&D Expect SVD Epidural or IV pain meds prn Nursery aware    Kristen Baldwin 05/07/2019, 11:02 PM

## 2019-05-07 NOTE — MAU Note (Signed)
Patient presents to MAU c/o ctx q30m. Patient reports + fm, bloody show, denies LOF

## 2019-05-08 ENCOUNTER — Inpatient Hospital Stay (HOSPITAL_COMMUNITY): Payer: Medicaid Other | Admitting: Anesthesiology

## 2019-05-08 ENCOUNTER — Encounter (HOSPITAL_COMMUNITY): Payer: Self-pay | Admitting: Obstetrics and Gynecology

## 2019-05-08 LAB — CBC
HCT: 40.7 % (ref 36.0–46.0)
HCT: 32.1 % — ABNORMAL LOW (ref 36.0–46.0)
Hemoglobin: 12.9 g/dL (ref 12.0–15.0)
Hemoglobin: 10.3 g/dL — ABNORMAL LOW (ref 12.0–15.0)
MCH: 28 pg (ref 26.0–34.0)
MCH: 28.1 pg (ref 26.0–34.0)
MCHC: 31.7 g/dL (ref 30.0–36.0)
MCHC: 32.1 g/dL (ref 30.0–36.0)
MCV: 88.5 fL (ref 80.0–100.0)
MCV: 87.7 fL (ref 80.0–100.0)
Platelets: 218 10*3/uL (ref 150–400)
Platelets: 185 10*3/uL (ref 150–400)
RBC: 4.6 MIL/uL (ref 3.87–5.11)
RBC: 3.66 MIL/uL — ABNORMAL LOW (ref 3.87–5.11)
RDW: 15.4 % (ref 11.5–15.5)
RDW: 15.3 % (ref 11.5–15.5)
WBC: 7.9 10*3/uL (ref 4.0–10.5)
WBC: 8.4 10*3/uL (ref 4.0–10.5)
nRBC: 0 % (ref 0.0–0.2)
nRBC: 0 % (ref 0.0–0.2)

## 2019-05-08 LAB — TYPE AND SCREEN
ABO/RH(D): O POS
Antibody Screen: NEGATIVE

## 2019-05-08 LAB — RPR: RPR Ser Ql: NONREACTIVE

## 2019-05-08 LAB — ABO/RH: ABO/RH(D): O POS

## 2019-05-08 MED ORDER — INFLUENZA VAC SPLIT QUAD 0.5 ML IM SUSY
0.5000 mL | PREFILLED_SYRINGE | INTRAMUSCULAR | Status: AC
  Administered 2019-05-09: 0.5 mL via INTRAMUSCULAR
  Filled 2019-05-08: qty 0.5

## 2019-05-08 MED ORDER — WITCH HAZEL-GLYCERIN EX PADS: 1.0000 "application " | MEDICATED_PAD | CUTANEOUS | Status: DC | PRN

## 2019-05-08 MED ORDER — ONDANSETRON HCL 4 MG PO TABS: 4.0000 mg | ORAL_TABLET | ORAL | Status: DC | PRN

## 2019-05-08 MED ORDER — OXYCODONE HCL 5 MG PO TABS: 10.0000 mg | ORAL_TABLET | ORAL | Status: DC | PRN

## 2019-05-08 MED ORDER — ACETAMINOPHEN 325 MG PO TABS
650.0000 mg | ORAL_TABLET | Freq: Once | ORAL | Status: DC
  Filled 2019-05-08: qty 2

## 2019-05-08 MED ORDER — SODIUM CHLORIDE 0.9 % IV SOLN
1.0000 g | Freq: Two times a day (BID) | INTRAVENOUS | Status: AC
  Administered 2019-05-08: 1 g via INTRAVENOUS
  Filled 2019-05-08: qty 1

## 2019-05-08 MED ORDER — BENZOCAINE-MENTHOL 20-0.5 % EX AERO
1.0000 "application " | INHALATION_SPRAY | CUTANEOUS | Status: DC | PRN
  Filled 2019-05-08: qty 56

## 2019-05-08 MED ORDER — IBUPROFEN 600 MG PO TABS
600.0000 mg | ORAL_TABLET | Freq: Four times a day (QID) | ORAL | Status: DC
  Administered 2019-05-08 – 2019-05-09 (×5): 600 mg via ORAL
  Filled 2019-05-08 (×5): qty 1

## 2019-05-08 MED ORDER — DIBUCAINE (PERIANAL) 1 % EX OINT: 1.0000 "application " | TOPICAL_OINTMENT | CUTANEOUS | Status: DC | PRN

## 2019-05-08 MED ORDER — LACTATED RINGERS IV SOLN: INTRAVENOUS | Status: DC

## 2019-05-08 MED ORDER — SODIUM CHLORIDE (PF) 0.9 % IJ SOLN
INTRAMUSCULAR | Status: DC | PRN
  Administered 2019-05-08: 12 mL/h via EPIDURAL

## 2019-05-08 MED ORDER — ACETAMINOPHEN 325 MG PO TABS
650.0000 mg | ORAL_TABLET | ORAL | Status: DC | PRN
  Administered 2019-05-08 – 2019-05-09 (×4): 650 mg via ORAL
  Filled 2019-05-08 (×3): qty 2

## 2019-05-08 MED ORDER — PRENATAL MULTIVITAMIN CH
1.0000 | ORAL_TABLET | Freq: Every day | ORAL | Status: DC
  Administered 2019-05-08 – 2019-05-09 (×2): 1 via ORAL
  Filled 2019-05-08 (×2): qty 1

## 2019-05-08 MED ORDER — SENNOSIDES-DOCUSATE SODIUM 8.6-50 MG PO TABS
2.0000 | ORAL_TABLET | ORAL | Status: DC
  Administered 2019-05-09: 2 via ORAL
  Filled 2019-05-08: qty 2

## 2019-05-08 MED ORDER — ZOLPIDEM TARTRATE 5 MG PO TABS: 5.0000 mg | ORAL_TABLET | Freq: Every evening | ORAL | Status: DC | PRN

## 2019-05-08 MED ORDER — DIPHENHYDRAMINE HCL 25 MG PO CAPS: 25.0000 mg | ORAL_CAPSULE | Freq: Four times a day (QID) | ORAL | Status: DC | PRN

## 2019-05-08 MED ORDER — LIDOCAINE HCL (PF) 1 % IJ SOLN
INTRAMUSCULAR | Status: DC | PRN
  Administered 2019-05-08: 5 mL via EPIDURAL

## 2019-05-08 MED ORDER — COCONUT OIL OIL
1.0000 "application " | TOPICAL_OIL | Status: DC | PRN
  Administered 2019-05-08: 1 via TOPICAL

## 2019-05-08 MED ORDER — SIMETHICONE 80 MG PO CHEW: 80.0000 mg | CHEWABLE_TABLET | ORAL | Status: DC | PRN

## 2019-05-08 MED ORDER — OXYCODONE HCL 5 MG PO TABS: 5.0000 mg | ORAL_TABLET | ORAL | Status: DC | PRN

## 2019-05-08 MED ORDER — ONDANSETRON HCL 4 MG/2ML IJ SOLN: 4.0000 mg | INTRAMUSCULAR | Status: DC | PRN

## 2019-05-08 NOTE — Anesthesia Postprocedure Evaluation (Signed)
Anesthesia Post Note  Patient: Ralphine Hinks  Procedure(s) Performed: AN AD [place]     Patient location during evaluation: Mother Baby Anesthesia Type: Epidural Level of consciousness: awake and alert and oriented Pain management: satisfactory to patient Vital Signs Assessment: post-procedure vital signs reviewed and stable Respiratory status: spontaneous breathing and nonlabored ventilation Cardiovascular status: stable Postop Assessment: no headache, no backache, no signs of nausea or vomiting, adequate PO intake, patient able to bend at knees and able to ambulate (patient up walking) Anesthetic complications: no    Last Vitals:  Vitals:   05/08/19 0615 05/08/19 0700  BP: (!) 139/55 116/80  Pulse: 82 84  Resp: 18 18  Temp: 37.1 C 36.9 C  SpO2: 100% 100%    Last Pain:  Vitals:   05/08/19 0700  TempSrc: Oral  PainSc: 0-No pain   Pain Goal:                   Patrica Mendell

## 2019-05-08 NOTE — Progress Notes (Signed)
Patient ID: Kristen Baldwin, female   DOB: 04-26-1986, 33 y.o.   MRN: [id number] Late entry Pt doing well with no complaints. She denies malaise, fever or cough. No HA/CP or SOB. She is ambulating in room, voiding well, tolerating diet and bonding with baby. Lochia mils VSS GEN - NAD ABD - FF EXT - no homans   8.4> 10.3<185  A/P: PPD#1 s/p svd - stable          Routine pp care         Likely discharge to home tomorrow

## 2019-05-08 NOTE — Anesthesia Preprocedure Evaluation (Signed)
Anesthesia Evaluation  Patient identified by MRN, date of birth, ID band Patient awake    Reviewed: Allergy & Precautions, NPO status , Patient's Chart, lab work & pertinent test results  Airway Mallampati: II  TM Distance: >3 FB Neck ROM: Full    Dental no notable dental hx.    Pulmonary  Covid +   Pulmonary exam normal breath sounds clear to auscultation       Cardiovascular negative cardio ROS Normal cardiovascular exam Rhythm:Regular Rate:Normal     Neuro/Psych  Headaches,    GI/Hepatic negative GI ROS, Neg liver ROS,   Endo/Other  negative endocrine ROS  Renal/GU negative Renal ROS     Musculoskeletal negative musculoskeletal ROS (+)   Abdominal   Peds  Hematology Hgb 12,9 Plt 218   Anesthesia Other Findings   Reproductive/Obstetrics (+) Pregnancy                             Anesthesia Physical Anesthesia Plan  ASA: II  Anesthesia Plan: Epidural   Post-op Pain Management:    Induction:   PONV Risk Score and Plan:   Airway Management Planned:   Additional Equipment:   Intra-op Plan:   Post-operative Plan:   Informed Consent: I have reviewed the patients History and Physical, chart, labs and discussed the procedure including the risks, benefits and alternatives for the proposed anesthesia with the patient or authorized representative who has indicated his/her understanding and acceptance.       Plan Discussed with:   Anesthesia Plan Comments: (39 wk G3P2 covid + for LEA)        Anesthesia Quick Evaluation

## 2019-05-08 NOTE — Anesthesia Procedure Notes (Signed)

## 2019-05-08 NOTE — Lactation Note (Signed)
This note was copied from a baby's chart. Lactation Consultation Note  Patient Name: Kristen Baldwin VFIEP'P Date: 05/08/2019 Reason for consult: Initial assessment  I conducted an initial consult with Ms. [name]. Her feeding plan is to offer breast milk and a bottle. She has limited experience with breast feeding her previous two children. She was not fully successful.   [name] states that her practice has been to offer the breast first at each feeding and then supplement with formula. She states that she has been giving formula because she's not sure if baby is getting enough to eat.  I educated on what to expect in the first days of breast feeding, differences between colostrum and mature milk, and when to expect her mature milk to transition. I encouraged her to attempt to breast feed each time, as she would like to breast feed upon discharge.  I showed [name] how to hand express. She was able to repeat back well, and we noted a small amount of colostrum after a few attempts. I then set up a DEBP and instructed [name] in the assembly, disassembly and cleaning. I showed her how to use the manual pump that comes with the kit as well.  I advised that [name] initiate several pumps tonight and tomorrow to stimulate her breasts. Baby has slightly elevated bilirubin levels. I recommended that she pump after breast feeding just as a precaution to adequately stimulate her breasts for milk production. I educated on what to expect with pumping in the first 1-3 days.  [name] breast fed and then bottle fed just prior to entry. Baby took about 10 mls, per mom. She would appreciate lactation observing her latch baby and offering some pointers on hwo to position. She states that the initial latch in delivery went well, and some of her more recent breast feeding sessions have made her nipples sore. I advised that she could page for lactation as needed, and that lactation would  also follow up with her tomorrow.   [name] does not have a breast pump at home. She does have [place]. I neglected to give her the breast feeding brochure; this will need to be provided at a future visit.  Maternal Data Formula Feeding for Exclusion: No Has patient been taught Hand Expression?: Yes Does the patient have breastfeeding experience prior to this delivery?: Yes  Feeding Feeding Type: Breast Fed  LATCH Score                   Interventions Interventions: Breast feeding basics reviewed;DEBP;Hand express  Lactation Tools Discussed/Used Tools: Pump Breast pump type: Double-Electric Breast Pump;Manual WIC Program: Yes Pump Review: Setup, frequency, and cleaning Initiated by:: hl Date initiated:: 05/08/19   Consult Status Consult Status: Follow-up Date: 05/09/19 Follow-up type: In-patient    [name] 05/08/2019, 11:22 PM

## 2019-05-09 ENCOUNTER — Inpatient Hospital Stay (HOSPITAL_COMMUNITY)
Admission: RE | Admit: 2019-05-09 | Discharge: 2019-05-09 | Disposition: A | Discharge status: Home / Self Care | Payer: Medicaid Other | Source: Ambulatory Visit

## 2019-05-09 MED ORDER — PRENATAL MULTIVITAMIN CH: 1.0000 | ORAL_TABLET | Freq: Every day | ORAL | 3 refills | Status: AC

## 2019-05-09 MED ORDER — IBUPROFEN 600 MG PO TABS: 600.0000 mg | ORAL_TABLET | Freq: Four times a day (QID) | ORAL | 1 refills | Status: AC

## 2019-05-09 NOTE — Progress Notes (Signed)
CSW received consult for hx of Anxiety.  CSW met with [name] to offer support and complete assessment.    CSW spoke with [name] via phone as [name] is on contact precautions at this time. CSW congratulated [name] on the birth of infant [name]). [name] was advised by CSW of the reason for CSW calling into the room. [name] reported that she dealt with anxiety awhile ago and suggested that she outgrew it. [name] reported that when she was dealing with anxiety it was related to her other children's father. [name] reported that she has not had any symptoms of anxiety in awhile and she is feeling fine. [name] reported that she has all essential items to care for infant with no further needs at this time.   CSW provided education regarding the baby blues period vs. perinatal mood disorders, discussed treatment and gave resources for mental health follow up if concerns arise.  CSW recommends self-evaluation during the postpartum time period using the New Mom Checklist from Postpartum Progress and encouraged [name] to contact a medical professional if symptoms are noted at any time.   CSW provided review of Sudden Infant Death Syndrome (SIDS) precautions.   CSW identifies no further need for intervention and no barriers to discharge at this time.     Kristen Baldwin, MSW, LCSW [facility] [place] [facility] [place] [phone]

## 2019-05-09 NOTE — Discharge Summary (Signed)
OB Discharge Summary     Patient Name: Kristen Baldwin DOB: October 15, 1985 MRN: [id number]  Date of admission: 05/07/2019 Delivering MD: Sherian ReinBOVARD-STUCKERT, Crystal Scarberry   Date of discharge: 05/09/2019  Admitting diagnosis: ctx seven minutes apart Intrauterine pregnancy: 7340w6d     Secondary diagnosis:  Principal Problem:   SVD (spontaneous vaginal delivery) Active Problems:   Indication for care in labor or delivery  Additional problems: COVID +     Discharge diagnosis: Term Pregnancy Delivered                                                                                                Post partum procedures:N/A  Augmentation: AROM  Complications: None  Hospital course:  Onset of Labor With Vaginal Delivery     33 y.o. yo G3P3003 at 8540w6d was admitted in Active Labor on 05/07/2019. Patient had an uncomplicated labor course as follows:  Membrane Rupture Time/Date: 2:16 AM ,05/08/2019   Intrapartum Procedures: Episiotomy: None [1]                                         Lacerations:  2nd degree [3]  Patient had a delivery of a Viable infant. 05/08/2019  Information for the patient's newborn:  Domenick BookbinderMosqueda, Girl [name] [[id number]][[id number]]  Delivery Method: Vaginal, Spontaneous(Filed from Delivery Summary)     Pateint had an uncomplicated postpartum course.  She is ambulating, tolerating a regular diet, passing flatus, and urinating well. Patient is discharged home in stable condition on 05/09/19.   Physical exam  Vitals:   05/08/19 1007 05/08/19 1633 05/08/19 2127 05/09/19 0605  BP: 121/86 105/68 125/81 111/78  Pulse: 83 74 83 76  Resp:  20  19  Temp: 98.7 F (37.1 C) 98.5 F (36.9 C) 97.8 F (36.6 C) 97.6 F (36.4 C)  TempSrc: Oral Oral Axillary Axillary  SpO2: 99% 98% 100% 99%  Weight:      Height:       General: alert and no distress Lochia: appropriate Uterine Fundus: firm Incision: Healing well with no significant drainage DVT Evaluation: No evidence of DVT seen on physical  exam. Labs: Lab Results  Component Value Date   WBC 8.4 05/08/2019   HGB 10.3 (L) 05/08/2019   HCT 32.1 (L) 05/08/2019   MCV 87.7 05/08/2019   PLT 185 05/08/2019   No flowsheet data found.  Discharge instruction: per After Visit Summary and "Baby and Me Booklet".  After visit meds:  Allergies as of 05/09/2019   No Known Allergies     Medication List    TAKE these medications   ibuprofen 600 MG tablet Commonly known as: ADVIL Take 1 tablet (600 mg total) by mouth every 6 (six) hours.   prenatal multivitamin Tabs tablet Take 1 tablet by mouth daily at 12 noon.       Diet: routine diet  Activity: Advance as tolerated. Pelvic rest for 6 weeks.   Outpatient follow up:6 weeks Follow up Appt:No future appointments. Follow up Visit:No follow-ups on file.  Postpartum  contraception: Undecided  Newborn Data: Live born female  Birth Weight: 8 lb 12.2 oz (3975 g) APGAR: 8, 9  Newborn Delivery   Birth date/time: 05/08/2019 03:14:00 Delivery type: Vaginal, Spontaneous      Baby Feeding: Breast Disposition:home with mother   05/09/2019 Janyth Contes, MD

## 2019-05-09 NOTE — Progress Notes (Signed)
CSW acknowledges consult for anxiety. CSW called into [name] room to speak with  her. [name] advised CSW that she was feeding infant at this time and asked that CSW call back later. CSW agreeable and will try back later.      Virgie Dad Suellyn Meenan, MSW, LCSW [facility] [place] [facility] [place] [phone]

## 2019-05-09 NOTE — Progress Notes (Signed)
Post Partum Day 2 Subjective: no complaints, up ad lib, voiding, tolerating PO and nl lochia, pain controlled  Objective: Blood pressure 111/78, pulse 76, temperature 97.6 F (36.4 C), temperature source Axillary, resp. rate 19, height 5\' 2"  (1.575 m), weight 81.6 kg, last menstrual period 08/02/2018, SpO2 99 %, unknown if currently breastfeeding.  Physical Exam:  General: alert and no distress Lochia: appropriate Uterine Fundus: firm   Recent Labs    05/07/19 2255 05/08/19 1303  HGB 12.9 10.3*  HCT 40.7 32.1*    Assessment/Plan: Discharge home, Breastfeeding and Lactation consult.  Routine PP care.  D/c with motrin and PNV.  F/u 6 weeks   LOS: 2 days   Dallie Patton Bovard-Stuckert 05/09/2019, 9:01 AM

## 2019-05-10 ENCOUNTER — Ambulatory Visit: Payer: Self-pay

## 2019-05-10 NOTE — Lactation Note (Signed)
This note was copied from a baby's chart. Lactation Consultation Note  Patient Name: Kristen Baldwin [id number] Date: 05/10/2019 Reason for consult: Follow-up assessment;Term;Other (Comment)(DAT (+))  37 hours old FT female who is being partially BF and formula fed by her mother, mom has been doing mostly bottles of Gerber the last 24 hours. She is COVID-19 (+) and going home today with baby. LC did consultation over the phone prior entering the room and mom said she didn't need to be seen, RN Chloe also checked with mom if she needed to see lactation and she declined at this time.  Her only concern at this point is sore nipples, she told LC baby hasn't been latching consistently and she hasn't been pumping either, she pumped once last night but not today. Explained to mom the importance of emptying the breast to protect her supply. She's currently using flanges # 27, LC offered to go in the room and check for the correct flange size but mom said she has an appointment with [facility] tomorrow and she'll be checking with them about the flanges because they'll be giving her a pump. Discussed prevention and treatment for sore nipples, mom also has coconut oil that she can use prior pumping.  RN Chloe also reviewed with mom engorgement prevention and treatment and discharge instructions. LC reinforced the need of emptying the breast to prevent engorgement, and the importance of pumping especially when she's giving baby a bottle of formula. Mom reported all questions and concerns were answered, she's aware of [place] OP services and will call PRN.  Maternal Data    Feeding    LATCH Score                   Interventions Interventions: Breast feeding basics reviewed;DEBP  Lactation Tools Discussed/Used Tools: Pump;Flanges Flange Size: 27 Breast pump type: Double-Electric Breast Pump   Consult Status Consult Status: Complete Date: 05/10/19 Follow-up type: Call as  needed    [place] 05/10/2019, 12:14 PM

## 2019-05-11 ENCOUNTER — Inpatient Hospital Stay (HOSPITAL_COMMUNITY): Payer: Medicaid Other

## 2019-05-11 ENCOUNTER — Inpatient Hospital Stay (HOSPITAL_COMMUNITY)
Admission: AD | Admit: 2019-05-11 | Payer: Medicaid Other | Source: Home / Self Care | Admitting: Obstetrics and Gynecology

## 2023-07-04 ENCOUNTER — Other Ambulatory Visit: Payer: Self-pay

## 2023-07-04 ENCOUNTER — Emergency Department (HOSPITAL_COMMUNITY)
Admission: EM | Admit: 2023-07-04 | Discharge: 2023-07-05 | Disposition: A | Discharge status: Home / Self Care | Payer: BC Managed Care – PPO | Attending: Emergency Medicine | Admitting: Emergency Medicine

## 2023-07-04 DIAGNOSIS — R519 Headache, unspecified: Secondary | ICD-10-CM | POA: Diagnosis not present

## 2023-07-04 DIAGNOSIS — R112 Nausea with vomiting, unspecified: Secondary | ICD-10-CM | POA: Diagnosis not present

## 2023-07-04 DIAGNOSIS — M542 Cervicalgia: Secondary | ICD-10-CM | POA: Diagnosis not present

## 2023-07-04 LAB — CBC
HCT: 43.8 % (ref 36.0–46.0)
Hemoglobin: 14.2 g/dL (ref 12.0–15.0)
MCH: 27.9 pg (ref 26.0–34.0)
MCHC: 32.4 g/dL (ref 30.0–36.0)
MCV: 86.1 fL (ref 80.0–100.0)
Platelets: 397 10*3/uL (ref 150–400)
RBC: 5.09 MIL/uL (ref 3.87–5.11)
RDW: 14.4 % (ref 11.5–15.5)
WBC: 10.6 10*3/uL — ABNORMAL HIGH (ref 4.0–10.5)
nRBC: 0 % (ref 0.0–0.2)

## 2023-07-04 LAB — BASIC METABOLIC PANEL
Anion gap: 12 (ref 5–15)
BUN: 9 mg/dL (ref 6–20)
CO2: 23 mmol/L (ref 22–32)
Calcium: 9.3 mg/dL (ref 8.9–10.3)
Chloride: 104 mmol/L (ref 98–111)
Creatinine, Ser: 0.7 mg/dL (ref 0.44–1.00)
GFR, Estimated: >60 mL/min (ref >60)
Glucose, Bld: 128 mg/dL — ABNORMAL HIGH (ref 70–99)
Potassium: 3.3 mmol/L — ABNORMAL LOW (ref 3.5–5.1)
Sodium: 139 mmol/L (ref 135–145)

## 2023-07-04 LAB — HCG, SERUM, QUALITATIVE: Preg, Serum: NEGATIVE

## 2023-07-04 NOTE — ED Triage Notes (Signed)
Pt arrives to ED c/o migraines x 1 day. Pt reports that pain is in front of head and back of neck. Pt endorses vomiting and nausea because pain. Pt also denies dizziness or blurry vision.

## 2023-07-05 ENCOUNTER — Emergency Department (HOSPITAL_COMMUNITY): Payer: BC Managed Care – PPO

## 2023-07-05 DIAGNOSIS — R519 Headache, unspecified: Secondary | ICD-10-CM | POA: Diagnosis not present

## 2023-07-05 MED ORDER — METOCLOPRAMIDE HCL 5 MG/ML IJ SOLN
10.0000 mg | Freq: Once | INTRAMUSCULAR | Status: AC
  Administered 2023-07-05: 10 mg via INTRAVENOUS
  Filled 2023-07-05: qty 2

## 2023-07-05 MED ORDER — DEXAMETHASONE SODIUM PHOSPHATE 10 MG/ML IJ SOLN
10.0000 mg | Freq: Once | INTRAMUSCULAR | Status: AC
  Administered 2023-07-05: 10 mg via INTRAVENOUS
  Filled 2023-07-05: qty 1

## 2023-07-05 MED ORDER — KETOROLAC TROMETHAMINE 15 MG/ML IJ SOLN
15.0000 mg | Freq: Once | INTRAMUSCULAR | Status: AC
  Administered 2023-07-05: 15 mg via INTRAVENOUS
  Filled 2023-07-05: qty 1

## 2023-07-05 MED ORDER — IOHEXOL 350 MG/ML SOLN
75.0000 mL | Freq: Once | INTRAVENOUS | Status: AC | PRN
  Administered 2023-07-05: 75 mL via INTRAVENOUS

## 2023-07-05 MED ORDER — DIPHENHYDRAMINE HCL 50 MG/ML IJ SOLN
25.0000 mg | Freq: Once | INTRAMUSCULAR | Status: AC
  Administered 2023-07-05: 25 mg via INTRAVENOUS
  Filled 2023-07-05: qty 1

## 2023-07-05 NOTE — ED Provider Notes (Signed)
Kristen Baldwin EMERGENCY DEPARTMENT AT [facility] Provider Note   CSN: [id number] Arrival date & time: 07/04/23  2245     History  Chief Complaint  Patient presents with   Emesis   Migraine    Kristen Baldwin is a 37 y.o. female.  Patient presents with "migraine".  Reports headache for the past 24 hours progressively worsening.  Headache started in her frontal lobe that has progressed to the back of her head and her neck.  Denies thunderclap onset.  Associate with nausea and vomiting x 2.  Some flashes of light in her vision but no blurry vision or double vision.  Does have photophobia and phonophobia.  Has had similar headaches in the past and been told it could be migraine but never had a formal diagnosis of such.  She is never had any brain imaging.  Denies any focal weakness, numbness or tingling.  No difficulty speaking or difficulty swallowing.  No fever.  No visual changes.  No dizziness or blurry vision.  No chest pain or shortness of breath.  Denies possibility of pregnancy.  The history is provided by the patient.  Emesis Associated symptoms: headaches   Associated symptoms: no abdominal pain, no arthralgias, no cough, no fever and no myalgias   Migraine Associated symptoms include headaches. Pertinent negatives include no chest pain, no abdominal pain and no shortness of breath.       Home Medications Prior to Admission medications   Medication Sig Start Date End Date Taking? Authorizing Provider  ibuprofen (ADVIL) 600 MG tablet Take 1 tablet (600 mg total) by mouth every 6 (six) hours. 05/09/19   Bovard-Stuckert, Augusto Gamble, MD  Prenatal Vit-Fe Fumarate-FA (PRENATAL MULTIVITAMIN) TABS tablet Take 1 tablet by mouth daily at 12 noon. 05/09/19   Bovard-Stuckert, Augusto Gamble, MD      Allergies    Patient has no known allergies.    Review of Systems   Review of Systems  Constitutional:  Negative for activity change, appetite change and fever.  HENT:  Negative for congestion  and rhinorrhea.   Eyes:  Positive for photophobia. Negative for visual disturbance.  Respiratory:  Negative for cough, chest tightness and shortness of breath.   Cardiovascular:  Negative for chest pain.  Gastrointestinal:  Positive for nausea and vomiting. Negative for abdominal pain.  Genitourinary:  Negative for dysuria and hematuria.  Musculoskeletal:  Negative for arthralgias and myalgias.  Skin:  Negative for rash.  Neurological:  Positive for headaches. Negative for dizziness, weakness and light-headedness.   all other systems are negative except as noted in the HPI and PMH.    Physical Exam Updated Vital Signs BP (!) 133/102 (BP Location: Right Arm)   Pulse 100   Temp 98 F (36.7 C)   Resp 18   Ht 5\' 1"  (1.549 m)   SpO2 100%   BMI 34.01 kg/m  Physical Exam Vitals and nursing note reviewed.  Constitutional:      General: She is not in acute distress.    Appearance: She is well-developed.  HENT:     Head: Normocephalic and atraumatic.     Mouth/Throat:     Pharynx: No oropharyngeal exudate.  Eyes:     Conjunctiva/sclera: Conjunctivae normal.     Pupils: Pupils are equal, round, and reactive to light.  Neck:     Comments: No meningismus. Cardiovascular:     Rate and Rhythm: Normal rate and regular rhythm.     Heart sounds: Normal heart sounds. No murmur heard. Pulmonary:  Effort: Pulmonary effort is normal. No respiratory distress.     Breath sounds: Normal breath sounds.  Abdominal:     Palpations: Abdomen is soft.     Tenderness: There is no abdominal tenderness. There is no guarding or rebound.  Musculoskeletal:        General: No tenderness. Normal range of motion.     Cervical back: Normal range of motion and neck supple.  Skin:    General: Skin is warm.  Neurological:     Mental Status: She is alert and oriented to person, place, and time.     Cranial Nerves: No cranial nerve deficit.     Motor: No abnormal muscle tone.     Coordination:  Coordination normal.     Comments: CN 2-12 intact, no ataxia on finger to nose, no nystagmus, 5/5 strength throughout, no pronator drift, Romberg negative, normal gait.   Psychiatric:        Behavior: Behavior normal.     ED Results / Procedures / Treatments   Labs (all labs ordered are listed, but only abnormal results are displayed) Labs Reviewed  CBC - Abnormal; Notable for the following components:      Result Value   WBC 10.6 (*)    All other components within normal limits  BASIC METABOLIC PANEL - Abnormal; Notable for the following components:   Potassium 3.3 (*)    Glucose, Bld 128 (*)    All other components within normal limits  HCG, SERUM, QUALITATIVE    EKG None  Radiology CT ANGIO HEAD NECK W WO CM  Result Date: 07/05/2023 CLINICAL DATA:  37 year old female with head and neck pain. Nausea and vomiting. EXAM: CT ANGIOGRAPHY HEAD AND NECK WITH AND WITHOUT CONTRAST TECHNIQUE: Multidetector CT imaging of the head and neck was performed using the standard protocol during bolus administration of intravenous contrast. Multiplanar CT image reconstructions and MIPs were obtained to evaluate the vascular anatomy. Carotid stenosis measurements (when applicable) are obtained utilizing NASCET criteria, using the distal internal carotid diameter as the denominator. RADIATION DOSE REDUCTION: This exam was performed according to the departmental dose-optimization program which includes automated exposure control, adjustment of the mA and/or kV according to patient size and/or use of iterative reconstruction technique. CONTRAST:  75mL OMNIPAQUE IOHEXOL 350 MG/ML SOLN COMPARISON:  None Available. FINDINGS: CT HEAD Brain: Normal cerebral volume. Mild asymmetry of the lateral ventricles appears to be normal variation. No midline shift, ventriculomegaly, mass effect, evidence of mass lesion, intracranial hemorrhage or evidence of cortically based acute infarction. Gray-white matter  differentiation is within normal limits throughout the brain. Partially empty sella. Calvarium and skull base: Intact, negative. Paranasal sinuses: Minimal paranasal sinus mucosal thickening. No sinus fluid or bubbly opacity. Tympanic cavities and mastoids appear clear. Orbits: Visualized orbits and scalp soft tissues are within normal limits. CTA NECK Skeleton: Negative.  No cervical spine degeneration by CT. Upper chest: Well aerated upper lungs, minor symmetric dependent atelectasis. 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 Other neck: Within normal limits. Aortic arch: Normal 3 vessel arch. Right carotid system: Normal. Left carotid system: Normal. Vertebral arteries: Proximal right subclavian artery and right vertebral artery origin are normal. Right vertebral is patent and normal to the skull base. Proximal left subclavian artery is tortuous at the thoracic inlet but without plaque or stenosis. Left vertebral artery origin is normal. Mildly non dominant left vertebral artery appears patent and within normal limits to the skull base. CTA HEAD Posterior circulation: Dominant right V4.  Distal vertebral arteries, vertebrobasilar junction appear normal. Right PICA is visible. Left PICA may be diminutive or absent. Patent basilar artery with mild tortuosity, no stenosis. Normal SCA and PCA origins. Posterior communicating arteries are diminutive or absent. Bilateral PCA branches are within normal limits. Anterior circulation: Both ICA siphons appear patent and within normal limits, mild petrous segment streak artifact suspected on the left. Normal ophthalmic artery origins. Patent carotid termini. Normal MCA and ACA origins. Mildly tortuous A1. Normal anterior communicating artery and bilateral ACA branches. Left MCA M1 segment and trifurcation are patent without stenosis. Right MCA bifurcates early without stenosis. Bilateral MCA branches are within normal limits. Venous sinuses: Adequate contrast,  patent. Anatomic variants: Mildly dominant right vertebral artery. 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 Review of the MIP images confirms the above findings IMPRESSION: 1. Normal CTA Head and Neck. 2. Normal CT appearance of the brain. Electronically Signed   By: Odessa Fleming M.D.   On: 07/05/2023 06:50    Procedures Procedures    Medications Ordered in ED Medications  dexamethasone (DECADRON) injection 10 mg (has no administration in time range)  ketorolac (TORADOL) 15 MG/ML injection 15 mg (has no administration in time range)  metoCLOPramide (REGLAN) injection 10 mg (has no administration in time range)  diphenhydrAMINE (BENADRYL) injection 25 mg (has no administration in time range)    ED Course/ Medical Decision Making/ A&P                                 Medical Decision Making Amount and/or Complexity of Data Reviewed Labs: ordered. Decision-making details documented in ED Course. Radiology: ordered and independent interpretation performed. Decision-making details documented in ED Course. ECG/medicine tests: ordered and independent interpretation performed. Decision-making details documented in ED Course.  Risk Prescription drug management.   1 day of diffuse headache and neck pain.  Nausea and vomiting and photophobia but no formal diagnosis of migraine.  Neurological exam is nonfocal.  Will treat as suspected migraine.  Low suspicion for subarachnoid hemorrhage, meningitis, temporal arteritis.  However she has had no formal imaging of her brain so will obtain CT scan.  Imaging is reassuring.  CT negative for hemorrhage or mass.  Results reviewed interpreted by me.  Patient's headache has resolved after Decadron, Reglan, Benadryl and Toradol.  She is not pregnant.  Follow-up with neurology for consideration of migraine headache and further evaluation.  Return to the ED with atypical headache, headache associate with unilateral weakness, numbness, tingling, difficulty speaking,  difficulty swallowing or other concerns.        Final Clinical Impression(s) / ED Diagnoses Final diagnoses:  Bad headache    Rx / DC Orders ED Discharge Orders     None         Hristopher Missildine, Jeannett Senior, MD 07/05/23 0710

## 2023-07-05 NOTE — Discharge Instructions (Signed)
Your testing is reassuring.  No evidence of bleeding in your brain or aneurysm.  Follow-up with the neurologist for further evaluation.  Return to the ED with unusual headache, difficulty speaking or difficulty swallowing, unilateral weakness, numbness, tingling, difficulty breathing, chest pain or other concerns.

## 2023-07-05 NOTE — ED Notes (Signed)
ED Provider at bedside. 

## 2023-12-16 ENCOUNTER — Emergency Department (HOSPITAL_BASED_OUTPATIENT_CLINIC_OR_DEPARTMENT_OTHER)
Admission: EM | Admit: 2023-12-16 | Discharge: 2023-12-16 | Disposition: A | Discharge status: Home / Self Care | Attending: Emergency Medicine | Admitting: Emergency Medicine

## 2023-12-16 ENCOUNTER — Other Ambulatory Visit: Payer: Self-pay

## 2023-12-16 ENCOUNTER — Encounter (HOSPITAL_BASED_OUTPATIENT_CLINIC_OR_DEPARTMENT_OTHER): Payer: Self-pay | Admitting: Emergency Medicine

## 2023-12-16 DIAGNOSIS — M542 Cervicalgia: Secondary | ICD-10-CM | POA: Diagnosis not present

## 2023-12-16 DIAGNOSIS — G44209 Tension-type headache, unspecified, not intractable: Secondary | ICD-10-CM | POA: Insufficient documentation

## 2023-12-16 DIAGNOSIS — R519 Headache, unspecified: Secondary | ICD-10-CM | POA: Diagnosis present

## 2023-12-16 LAB — CBC WITH DIFFERENTIAL/PLATELET
Abs Immature Granulocytes: 0.02 10*3/uL (ref 0.00–0.07)
Basophils Absolute: 0 10*3/uL (ref 0.0–0.1)
Basophils Relative: 0 %
Eosinophils Absolute: 0.2 10*3/uL (ref 0.0–0.5)
Eosinophils Relative: 2 %
HCT: 44.1 % (ref 36.0–46.0)
Hemoglobin: 14.6 g/dL (ref 12.0–15.0)
Immature Granulocytes: 0 %
Lymphocytes Relative: 31 %
Lymphs Abs: 3 10*3/uL (ref 0.7–4.0)
MCH: 28.3 pg (ref 26.0–34.0)
MCHC: 33.1 g/dL (ref 30.0–36.0)
MCV: 85.6 fL (ref 80.0–100.0)
Monocytes Absolute: 0.4 10*3/uL (ref 0.1–1.0)
Monocytes Relative: 4 %
Neutro Abs: 6 10*3/uL (ref 1.7–7.7)
Neutrophils Relative %: 63 %
Platelets: 352 10*3/uL (ref 150–400)
RBC: 5.15 MIL/uL — ABNORMAL HIGH (ref 3.87–5.11)
RDW: 14.8 % (ref 11.5–15.5)
WBC: 9.6 10*3/uL (ref 4.0–10.5)
nRBC: 0 % (ref 0.0–0.2)

## 2023-12-16 LAB — BASIC METABOLIC PANEL WITH GFR
Anion gap: 14 (ref 5–15)
BUN: 10 mg/dL (ref 6–20)
CO2: 21 mmol/L — ABNORMAL LOW (ref 22–32)
Calcium: 9.9 mg/dL (ref 8.9–10.3)
Chloride: 105 mmol/L (ref 98–111)
Creatinine, Ser: 0.89 mg/dL (ref 0.44–1.00)
GFR, Estimated: >60 mL/min (ref >60)
Glucose, Bld: 93 mg/dL (ref 70–99)
Potassium: 3.6 mmol/L (ref 3.5–5.1)
Sodium: 140 mmol/L (ref 135–145)

## 2023-12-16 LAB — PREGNANCY, URINE: Preg Test, Ur: NEGATIVE

## 2023-12-16 LAB — MAGNESIUM: Magnesium: 2.2 mg/dL (ref 1.7–2.4)

## 2023-12-16 MED ORDER — DIPHENHYDRAMINE HCL 50 MG/ML IJ SOLN
25.0000 mg | Freq: Once | INTRAMUSCULAR | Status: AC
Start: 2023-12-16 — End: 2023-12-16
  Administered 2023-12-16: 25 mg via INTRAVENOUS
  Filled 2023-12-16: qty 1

## 2023-12-16 MED ORDER — DEXAMETHASONE SODIUM PHOSPHATE 10 MG/ML IJ SOLN
8.0000 mg | Freq: Once | INTRAMUSCULAR | Status: AC
  Administered 2023-12-16: 8 mg via INTRAVENOUS
  Filled 2023-12-16: qty 1

## 2023-12-16 MED ORDER — ACETAMINOPHEN 325 MG PO TABS
650.0000 mg | ORAL_TABLET | Freq: Once | ORAL | Status: AC
  Administered 2023-12-16: 650 mg via ORAL
  Filled 2023-12-16: qty 2

## 2023-12-16 MED ORDER — METOCLOPRAMIDE HCL 5 MG/ML IJ SOLN
10.0000 mg | Freq: Once | INTRAMUSCULAR | Status: AC
  Administered 2023-12-16: 10 mg via INTRAVENOUS
  Filled 2023-12-16: qty 2

## 2023-12-16 MED ORDER — KETOROLAC TROMETHAMINE 15 MG/ML IJ SOLN
15.0000 mg | Freq: Once | INTRAMUSCULAR | Status: AC
  Administered 2023-12-16: 15 mg via INTRAVENOUS
  Filled 2023-12-16: qty 1

## 2023-12-16 MED ORDER — LIDOCAINE 5 % EX PTCH
1.0000 | MEDICATED_PATCH | CUTANEOUS | Status: DC
  Administered 2023-12-16: 1 via TRANSDERMAL
  Filled 2023-12-16: qty 1

## 2023-12-16 NOTE — ED Notes (Signed)
 Discharge paperwork given and verbally understood.

## 2023-12-16 NOTE — Discharge Instructions (Addendum)
I am glad you are feeling better. Please follow up with your primary care provider. Seek emergency care if experiencing any new or worsening symptoms.

## 2023-12-16 NOTE — ED Provider Notes (Signed)
 Kristen Baldwin   CSN: [id number] Arrival date & time: 12/16/23  1542     History  Chief Complaint  Patient presents with   Headache    Kristen Baldwin is a 38 y.o. female with PMHx anxiety, headaches who presents to ED concerned for headache since yesterday morning. Headache is frontal and patient also endorsing some neck pain as well. Patient took a pill with Advil /caffeine in it earlier today which did not provide relief. Patient with similar migraine a couple of months ago which presented similarly and resolved after migraine cocktail. Patient endorses some nausea as well.  Denies head trauma, LOC, seizures, blood thinners. Nauseated as well. Denies fever, chest pain, dyspnea, cough, vomiting, diarrhea.  LMP 10 days ago.    Headache      Home Medications Prior to Admission medications   Medication Sig Start Date End Date Taking? Authorizing Provider  ibuprofen  (ADVIL ) 600 MG tablet Take 1 tablet (600 mg total) by mouth every 6 (six) hours. 05/09/19   Bovard-Stuckert, Irwin Manual, MD  Prenatal Vit-Fe Fumarate-FA (PRENATAL MULTIVITAMIN) TABS tablet Take 1 tablet by mouth daily at 12 noon. 05/09/19   Bovard-Stuckert, Jody, MD      Allergies    Patient has no known allergies.    Review of Systems   Review of Systems  Neurological:  Positive for headaches.    Physical Exam Updated Vital Signs BP (!) 138/101 (BP Location: Right Arm)   Pulse 70   Temp 98.4 F (36.9 C)   Resp 16   Ht 5\' 1"  (1.549 m)   Wt 81.6 kg   LMP 12/06/2023 (Exact Date)   SpO2 100%   BMI 33.99 kg/m  Physical Exam Vitals and nursing Baldwin reviewed.  Constitutional:      General: She is not in acute distress.    Appearance: She is not ill-appearing or toxic-appearing.  HENT:     Head: Normocephalic and atraumatic.     Mouth/Throat:     Mouth: Mucous membranes are moist.     Pharynx: No oropharyngeal exudate or posterior oropharyngeal erythema.   Eyes:     General: No scleral icterus.       Right eye: No discharge.        Left eye: No discharge.     Conjunctiva/sclera: Conjunctivae normal.  Cardiovascular:     Rate and Rhythm: Normal rate and regular rhythm.     Pulses: Normal pulses.     Heart sounds: Normal heart sounds. No murmur heard. Pulmonary:     Effort: Pulmonary effort is normal. No respiratory distress.     Breath sounds: Normal breath sounds. No wheezing, rhonchi or rales.  Abdominal:     General: Bowel sounds are normal. There is no distension.     Palpations: Abdomen is soft. There is no mass.     Tenderness: There is no abdominal tenderness.  Musculoskeletal:     Right lower leg: No edema.     Left lower leg: No edema.  Skin:    General: Skin is warm and dry.     Findings: No rash.  Neurological:     General: No focal deficit present.     Mental Status: She is alert. Mental status is at baseline.     Comments: GCS 15. Speech is goal oriented. No deficits appreciated to CN III-XII; symmetric eyebrow raise, no facial drooping, tongue midline. Patient has equal grip strength bilaterally with 5/5 strength against resistance in all major  muscle groups bilaterally. Sensation to light touch intact. Patient moves extremities without ataxia.    Psychiatric:        Mood and Affect: Mood normal.        Behavior: Behavior normal.     ED Results / Procedures / Treatments   Labs (all labs ordered are listed, but only abnormal results are displayed) Labs Reviewed  CBC WITH DIFFERENTIAL/PLATELET - Abnormal; Notable for the following components:      Result Value   RBC 5.15 (*)    All other components within normal limits  BASIC METABOLIC PANEL WITH GFR - Abnormal; Notable for the following components:   CO2 21 (*)    All other components within normal limits  PREGNANCY, URINE  MAGNESIUM    EKG None  Radiology No results found.  Procedures Procedures    Medications Ordered in ED Medications   lidocaine  (LIDODERM ) 5 % 1 patch (1 patch Transdermal Patch Applied 12/16/23 1906)  acetaminophen  (TYLENOL ) tablet 650 mg (650 mg Oral Given 12/16/23 1905)  dexamethasone  (DECADRON ) injection 8 mg (8 mg Intravenous Given 12/16/23 2006)  metoCLOPramide  (REGLAN ) injection 10 mg (10 mg Intravenous Given 12/16/23 2007)  diphenhydrAMINE  (BENADRYL ) injection 25 mg (25 mg Intravenous Given 12/16/23 2003)  ketorolac  (TORADOL ) 15 MG/ML injection 15 mg (15 mg Intravenous Given 12/16/23 2001)    ED Course/ Medical Decision Making/ A&P                                 Medical Decision Making Amount and/or Complexity of Data Reviewed Labs: ordered.  Risk OTC drugs. Prescription drug management.   This patient presents to the ED for concern of headache, this involves an extensive number of treatment options, and is a complaint that carries with it a high risk of complications and morbidity.  The differential diagnosis includes migraine, tension headache, cluster headache, subarachnoid hemorrhage, meningitis/encephalitis, ischemic stroke, ICH, cervical artery dissection   Co morbidities that complicate the patient evaluation  headaches   Additional history obtained:  Additional history obtained from 06/2023 CTA head/neck:   1. Normal CTA Head and Neck.  2. Normal CT appearance of the brain.    Problem List / ED Course / Critical interventions / Medication management  Patient presents to ED with headache.  Similar to prior headache a couple months ago which resolved well with migraine cocktail.  Physical/neuroexam reassuring.  Patient afebrile with stable vitals.  Patient endorses some nausea but denies any other infectious symptoms today. I Ordered, and personally interpreted labs.  Magnesium within normal limits.  UPT negative.  BMP reassuring.  CBC without leukocytosis or anemia. It appears the patient is suffering from a tension headache.  Provided patient with migraine cocktail.  Upon  reevaluation, patient stated she feels better and is ready to go home.  Recommended following up with PCP. I have reviewed the patients home medicines and have made adjustments as needed The patient has been appropriately medically screened and/or stabilized in the ED. I have low suspicion for any other emergent medical condition which would require further screening, evaluation or treatment in the ED or require inpatient management. At time of discharge the patient is hemodynamically stable and in no acute distress. I have discussed work-up results and diagnosis with patient and answered all questions. Patient is agreeable with discharge plan. We discussed strict return precautions for returning to the emergency department and they verbalized understanding.    Ddx: these  are considered less likely due to history of present illness and physical exam -subarachnoid hemorrhage: no neurodeficits, vomiting -meningitis/encephalitis: stable vital signs,  lack of meningismus symptoms -ischemic stroke/ICH/cervical artery dissection: no neurodeficits   Social Determinants of Health:  none          Final Clinical Impression(s) / ED Diagnoses Final diagnoses:  Tension headache    Rx / DC Orders ED Discharge Orders     None         Don Fritter 12/16/23 2040    Onetha Bile, MD 12/17/23 1502

## 2023-12-16 NOTE — ED Triage Notes (Signed)
 Pt via pov from home with headache since yesterday morning. She reports that she had a migraine a few months ago and was treated with headache cocktail, which made it better. She has had no more headaches since then until yesterday. Pt alert & oriented, nad noted.

## 2024-06-14 DIAGNOSIS — Z363 Encounter for antenatal screening for malformations: Secondary | ICD-10-CM | POA: Diagnosis not present

## 2024-06-14 DIAGNOSIS — Z23 Encounter for immunization: Secondary | ICD-10-CM | POA: Diagnosis not present

## 2024-06-14 DIAGNOSIS — Z3A19 19 weeks gestation of pregnancy: Secondary | ICD-10-CM | POA: Diagnosis not present

## 2024-06-14 DIAGNOSIS — O09522 Supervision of elderly multigravida, second trimester: Secondary | ICD-10-CM | POA: Diagnosis not present

## 2024-07-10 DIAGNOSIS — Z362 Encounter for other antenatal screening follow-up: Secondary | ICD-10-CM | POA: Diagnosis not present

## 2024-07-10 DIAGNOSIS — Z3A23 23 weeks gestation of pregnancy: Secondary | ICD-10-CM | POA: Diagnosis not present

## 2024-07-10 DIAGNOSIS — Z3490 Encounter for supervision of normal pregnancy, unspecified, unspecified trimester: Secondary | ICD-10-CM | POA: Diagnosis not present

## 2024-07-10 DIAGNOSIS — Z3482 Encounter for supervision of other normal pregnancy, second trimester: Secondary | ICD-10-CM | POA: Diagnosis not present

## 2024-08-08 DIAGNOSIS — Z3689 Encounter for other specified antenatal screening: Secondary | ICD-10-CM | POA: Diagnosis not present

## 2024-08-08 DIAGNOSIS — O09522 Supervision of elderly multigravida, second trimester: Secondary | ICD-10-CM | POA: Diagnosis not present

## 2024-08-08 DIAGNOSIS — Z3482 Encounter for supervision of other normal pregnancy, second trimester: Secondary | ICD-10-CM | POA: Diagnosis not present

## 2024-08-08 DIAGNOSIS — Z3A27 27 weeks gestation of pregnancy: Secondary | ICD-10-CM | POA: Diagnosis not present
